# Patient Record
Sex: Male | Born: 1950 | Race: Asian | Marital: Married | Smoking: Current every day smoker
Health system: Southern US, Community
[De-identification: ages and names within clinical notes are randomized; demographics above are authoritative.]

## PROBLEM LIST (undated history)

## (undated) DIAGNOSIS — I2510 Atherosclerotic heart disease of native coronary artery without angina pectoris: Secondary | ICD-10-CM

## (undated) DIAGNOSIS — E119 Type 2 diabetes mellitus without complications: Secondary | ICD-10-CM

## (undated) DIAGNOSIS — R0989 Other specified symptoms and signs involving the circulatory and respiratory systems: Secondary | ICD-10-CM

## (undated) DIAGNOSIS — K219 Gastro-esophageal reflux disease without esophagitis: Secondary | ICD-10-CM

## (undated) DIAGNOSIS — I209 Angina pectoris, unspecified: Secondary | ICD-10-CM

## (undated) DIAGNOSIS — I10 Essential (primary) hypertension: Secondary | ICD-10-CM

## (undated) DIAGNOSIS — E785 Hyperlipidemia, unspecified: Secondary | ICD-10-CM

## (undated) DIAGNOSIS — D649 Anemia, unspecified: Secondary | ICD-10-CM

## (undated) DIAGNOSIS — R079 Chest pain, unspecified: Secondary | ICD-10-CM

## (undated) DIAGNOSIS — N433 Hydrocele, unspecified: Secondary | ICD-10-CM

## (undated) HISTORY — DX: Hydrocele, unspecified: N43.3

## (undated) HISTORY — DX: Gastro-esophageal reflux disease without esophagitis: K21.9

## (undated) HISTORY — DX: Atherosclerotic heart disease of native coronary artery without angina pectoris: I25.10

## (undated) HISTORY — DX: Chest pain, unspecified: R07.9

## (undated) HISTORY — PX: CORONARY ARTERY BYPASS GRAFT: SHX141

## (undated) HISTORY — DX: Anemia, unspecified: D64.9

## (undated) HISTORY — PX: CARDIAC CATHETERIZATION: SHX172

## (undated) HISTORY — DX: Angina pectoris, unspecified: I20.9

## (undated) HISTORY — DX: Other specified symptoms and signs involving the circulatory and respiratory systems: R09.89

## (undated) HISTORY — DX: Type 2 diabetes mellitus without complications: E11.9

## (undated) HISTORY — DX: Hyperlipidemia, unspecified: E78.5

## (undated) HISTORY — DX: Essential (primary) hypertension: I10

## (undated) HISTORY — PX: HERNIA REPAIR: SHX51

---

## 2017-05-27 ENCOUNTER — Other Ambulatory Visit: Payer: Self-pay | Admitting: Urology

## 2017-05-27 DIAGNOSIS — C61 Malignant neoplasm of prostate: Secondary | ICD-10-CM

## 2017-06-21 ENCOUNTER — Inpatient Hospital Stay
Admission: RE | Admit: 2017-06-21 | Discharge: 2017-06-21 | Disposition: A | Discharge status: Home / Self Care | Payer: Self-pay | Source: Ambulatory Visit | Attending: Urology | Admitting: Urology

## 2017-06-29 ENCOUNTER — Ambulatory Visit
Admission: RE | Admit: 2017-06-29 | Discharge: 2017-06-29 | Disposition: A | Discharge status: Home / Self Care | Payer: Medicare Other | Source: Ambulatory Visit | Attending: Urology | Admitting: Urology

## 2017-06-29 DIAGNOSIS — C61 Malignant neoplasm of prostate: Secondary | ICD-10-CM

## 2017-06-29 MED ORDER — GADOBENATE DIMEGLUMINE 529 MG/ML IV SOLN
17.0000 mL | Freq: Once | INTRAVENOUS | Status: AC | PRN
Start: 2017-06-29 — End: 2017-06-29
  Administered 2017-06-29: 17 mL via INTRAVENOUS

## 2018-06-03 ENCOUNTER — Ambulatory Visit (INDEPENDENT_AMBULATORY_CARE_PROVIDER_SITE_OTHER): Payer: Medicare Other | Admitting: Cardiology

## 2018-06-03 ENCOUNTER — Ambulatory Visit: Payer: Self-pay | Admitting: Cardiology

## 2018-06-03 ENCOUNTER — Encounter: Payer: Self-pay | Admitting: Cardiology

## 2018-06-03 VITALS — BP 136/97 | HR 91 | Ht 67.0 in | Wt 180.2 lb

## 2018-06-03 DIAGNOSIS — Z951 Presence of aortocoronary bypass graft: Secondary | ICD-10-CM

## 2018-06-03 DIAGNOSIS — R0989 Other specified symptoms and signs involving the circulatory and respiratory systems: Secondary | ICD-10-CM | POA: Insufficient documentation

## 2018-06-03 DIAGNOSIS — Z87891 Personal history of nicotine dependence: Secondary | ICD-10-CM | POA: Diagnosis not present

## 2018-06-03 DIAGNOSIS — E1165 Type 2 diabetes mellitus with hyperglycemia: Secondary | ICD-10-CM

## 2018-06-03 DIAGNOSIS — I2510 Atherosclerotic heart disease of native coronary artery without angina pectoris: Secondary | ICD-10-CM

## 2018-06-03 DIAGNOSIS — E785 Hyperlipidemia, unspecified: Secondary | ICD-10-CM

## 2018-06-03 DIAGNOSIS — F17200 Nicotine dependence, unspecified, uncomplicated: Secondary | ICD-10-CM | POA: Insufficient documentation

## 2018-06-03 DIAGNOSIS — IMO0002 Reserved for concepts with insufficient information to code with codable children: Secondary | ICD-10-CM | POA: Insufficient documentation

## 2018-06-03 DIAGNOSIS — R079 Chest pain, unspecified: Secondary | ICD-10-CM | POA: Insufficient documentation

## 2018-06-03 NOTE — Progress Notes (Signed)
Subjective:   @Patient  ID@: Mark Combs, male    DOB: [birth year], 68 y.o.   MRN: [id number]  Jilda Panda, MD:  No chief complaint on file.   HPI: Mark Combs  is a 68 y.o. male  with hypertension, uncontrolled type 2 diabetes, hyperlipidemia, former tobacco use, CAD s/p CABG x4 in [place], [place] in 2012 recently evaluated by [place] for chest pain concerning for angina.   HPI   Patient reports that the prior chest pain, that he experienced during emotional distress, has resolved. He has not been having any chest tightness or pain. He does mention the he still experiences upper chest soreness at his incision related to lifting some heavy boxes at work. He has not used any nitroglycerin. Denies any shortness of breath, leg swelling, palpitations, or symtpoms of claudication or TIA. He is still on Lipitor and has had some labs performed with PCP in January. He reports that his glucose and lipids have been high in his recent lab work but hasn't discussed the results with his PCP as of now.  He has recently made lifestyle changes and quit smoking three weeks ago. He is currently not exercising due to long working hours and discomfort and pain in his knee joints. He reports a persistent cough that has been treated with oral antibiotics for a month with marked improvement up to date.    Past Medical History:  Diagnosis Date  . Anemia   . Angina pectoris ([place])   . Coronary artery disease   . Diabetes mellitus without complication ([place])   . GERD (gastroesophageal reflux disease)   . Hydrocele   . Hyperlipidemia   . Hypertension     Past Surgical History:  Procedure Laterality Date  . CORONARY ARTERY BYPASS GRAFT    . HERNIA REPAIR      No family history on file.  Social History   Socioeconomic History  . Marital status: Married    Spouse name: Not on file  . Number of children: Not on file  . Years of education: Not on file  . Highest education level: Not on file    Occupational History  . Not on file  Social Needs  . Financial resource strain: Not on file  . Food insecurity:    Worry: Not on file    Inability: Not on file  . Transportation needs:    Medical: Not on file    Non-medical: Not on file  Tobacco Use  . Smoking status: Current Every Day Smoker  . Smokeless tobacco: Never Used  Substance and Sexual Activity  . Alcohol use: Not on file  . Drug use: Not on file  . Sexual activity: Not on file  Lifestyle  . Physical activity:    Days per week: Not on file    Minutes per session: Not on file  . Stress: Not on file  Relationships  . Social connections:    Talks on phone: Not on file    Gets together: Not on file    Attends religious service: Not on file    Active member of club or organization: Not on file    Attends meetings of clubs or organizations: Not on file    Relationship status: Not on file  . Intimate partner violence:    Fear of current or ex partner: Not on file    Emotionally abused: Not on file    Physically abused: Not on file    Forced sexual activity: Not  on file  Other Topics Concern  . Not on file  Social History Narrative  . Not on file    Current Meds  Medication Sig  . acetaminophen (TYLENOL) 500 MG tablet Take 500 mg by mouth every 6 (six) hours as needed.  Marland Kitchen aspirin EC 81 MG tablet Take 81 mg by mouth daily.  Marland Kitchen atorvastatin (LIPITOR) 40 MG tablet Take 40 mg by mouth daily.  . dapagliflozin propanediol (FARXIGA) 10 MG TABS tablet Take 10 mg by mouth daily.  Marland Kitchen esomeprazole (NEXIUM) 40 MG capsule Take 40 mg by mouth daily at 12 noon.  Marland Kitchen glimepiride (AMARYL) 2 MG tablet Take 2 mg by mouth daily with breakfast.  . metoprolol succinate (TOPROL-XL) 25 MG 24 hr tablet Take 25 mg by mouth 2 (two) times daily.   . nitroGLYCERIN (NITROSTAT) 0.4 MG SL tablet Place 0.4 mg under the tongue every 5 (five) minutes as needed for chest pain.  . tamsulosin (FLOMAX) 0.4 MG CAPS capsule Take 0.4 mg by mouth daily.  .  valsartan (DIOVAN) 80 MG tablet Take 80 mg by mouth daily.     Review of Systems  Constitution: Negative for decreased appetite, malaise/fatigue, weight gain and weight loss.  Eyes: Negative for visual disturbance.  Cardiovascular: Negative for chest pain (no chest tightness), claudication and leg swelling.  Respiratory: Negative for hemoptysis, shortness of breath and wheezing.   Endocrine: Negative for cold intolerance and heat intolerance.  Skin: Negative for nail changes.  Musculoskeletal: Negative for myalgias.  Gastrointestinal: Negative for abdominal pain, nausea and vomiting.  Neurological: Negative for difficulty with concentration, dizziness, focal weakness and headaches.  Psychiatric/Behavioral: Negative for altered mental status and suicidal ideas.  All other systems reviewed and are negative.   Cardiac studies:   Echocardiogram 03/27/2018: Left ventricle cavity is normal in size. Mild concentric hypertrophy of the left ventricle. Abnormal septal wall motion due to post-operative coronary artery bypass graft. Doppler evidence of grade I (impaired) diastolic dysfunction, normal LAP. Calculated EF 55%. Left atrial cavity is mildly dilated. Mild tricuspid regurgitation. Estimated pulmonary artery systolic pressure 30 mmHg.  Exercise sestamibi stress test 03/31/2018: 1. The patient performed treadmill exercise using Bruce protocol, completing 5:01 minutes. The patient completed an estimated workload of 7 METS, reaching 99% of the maximum predicted heart rate. Normal hemodynamic response was seen. No stress symptoms reported. Exercise capacity was low. No ischemic changes seen on stress electrocardiogram. 2. The overall quality of the study is good. Left ventricular cavity is noted to be normal on the rest and stress studies. Gated SPECT images reveal normal myocardial thickening and wall motion. The left ventricular ejection fraction was calculated or visually estimated to be  53%. SPECT images reveal small size, mild intensity, mildly reversible perfusion defect in basal inferior/inferolateral myocardium suggestive of ischemia.  3. Low risk study.  Carotid artery duplex 03/27/2018: No hemodynamically significant arterial disease in the internal carotid artery bilaterally. There is minimal plaque bilateral carotids. Antegrade right vertebral artery flow. Antegrade left vertebral artery flow.  Coronary angiograms 05/04/2010: Normal LV systolic function, mid LAD 95% stenosis,  D1 tandem 75% stenosis, proximal circumflex 95% stenosis and extends into large OM1.  Mid RCA 75% stenosis.  Normal LVEF.    Objective:     Today's Vitals   06/03/18 1115  BP: (!) 136/97  Pulse: 91  SpO2: 96%  Weight: 180 lb 3.2 oz (81.7 kg)  Height: 5\' 7"  (1.702 m)   Body mass index is 28.22 kg/m.    Physical  Exam  Constitutional: He is oriented to person, place, and time. Vital signs are normal. He appears well-developed and well-nourished.  HENT:  Head: Normocephalic and atraumatic.  Neck: Normal range of motion.  Cardiovascular: Normal rate, regular rhythm, normal heart sounds and intact distal pulses.  Pulses:      Carotid pulses are on the right side with bruit and 2+ on the left side.      Dorsalis pedis pulses are 2+ on the right side and 2+ on the left side.       Posterior tibial pulses are 2+ on the right side and 2+ on the left side.  Pulmonary/Chest: Effort normal and breath sounds normal. No accessory muscle usage. No respiratory distress.  Abdominal: Soft. Bowel sounds are normal.  Musculoskeletal: Normal range of motion.  Neurological: He is alert and oriented to person, place, and time.  Skin: Skin is warm and dry.  Vitals reviewed.      Assessment & Recommendations:   1. Atherosclerosis of native coronary artery of native heart without angina pectoris Presently without symptoms of angina, previous chest pain has resolved. Currently under appropriate  medication. Patient was recommended to take nitro when symptoms of chest pain or chest tightness appear. It was recommended to the patient to exercise more preferably in the pool to reduce the pressure on his joints.  2. History of coronary artery bypass graft As stated above on appropriate medical therapy.  3. Former tobacco use Patient has quit smoking recently. Encouraged continued smoking secession.   4. Uncontrolled type 2 diabetes mellitus with hyperglycemia ([place]) On previous PCP office insulin therapy was recommended, however patient did not want to start on insulin and has continued with [place]. Recent labs from two weeks ago haven't been discussed with patient yet. He will continue to follow up with his PCP regarding the therapeutic management.   5. Hyperlipidemia LDL goal <70 Recent labs unavailable at this time, we will request from PCP office. Continued to be managed by PCP.  Follow up was scheduled in 6 months. Further evaluation or changes if needed will be done after receiving lab values.  Patient was advised to call the office if there any symptoms develop prior to 6 month follow up.     Jeri Lager, FNP-C [facility], [place] Office: [phone] Fax: [phone]

## 2018-07-07 ENCOUNTER — Other Ambulatory Visit: Payer: Self-pay | Admitting: Cardiology

## 2018-09-02 ENCOUNTER — Telehealth: Payer: Self-pay

## 2018-09-02 ENCOUNTER — Other Ambulatory Visit: Payer: Self-pay

## 2018-09-02 NOTE — Telephone Encounter (Signed)
Patient left message with answering service requesting refill on BP med. Patients number is [phone]

## 2018-09-03 MED ORDER — OLMESARTAN MEDOXOMIL 20 MG PO TABS: 20.0000 mg | ORAL_TABLET | Freq: Every day | ORAL | 3 refills | Status: DC

## 2018-12-01 ENCOUNTER — Other Ambulatory Visit: Payer: Self-pay | Admitting: Cardiology

## 2018-12-03 ENCOUNTER — Ambulatory Visit (INDEPENDENT_AMBULATORY_CARE_PROVIDER_SITE_OTHER): Payer: Medicare Other | Admitting: Cardiology

## 2018-12-03 ENCOUNTER — Encounter: Payer: Self-pay | Admitting: Cardiology

## 2018-12-03 ENCOUNTER — Other Ambulatory Visit: Payer: Self-pay

## 2018-12-03 VITALS — BP 132/79 | HR 82 | Ht 67.0 in | Wt 180.0 lb

## 2018-12-03 DIAGNOSIS — E1165 Type 2 diabetes mellitus with hyperglycemia: Secondary | ICD-10-CM | POA: Diagnosis not present

## 2018-12-03 DIAGNOSIS — Z87891 Personal history of nicotine dependence: Secondary | ICD-10-CM

## 2018-12-03 DIAGNOSIS — E785 Hyperlipidemia, unspecified: Secondary | ICD-10-CM | POA: Diagnosis not present

## 2018-12-03 DIAGNOSIS — Z951 Presence of aortocoronary bypass graft: Secondary | ICD-10-CM | POA: Diagnosis not present

## 2018-12-03 DIAGNOSIS — I2510 Atherosclerotic heart disease of native coronary artery without angina pectoris: Secondary | ICD-10-CM | POA: Diagnosis not present

## 2018-12-03 NOTE — Progress Notes (Signed)
Primary Physician:  Jilda Panda, MD   Patient ID: Mark Combs, male    DOB: 1950/11/21, 68 y.o.   MRN: [id number]  Subjective:    Chief Complaint  Patient presents with  . Coronary Artery Disease  . Hyperlipidemia  . Follow-up    HPI: Mark Combs  is a 68 y.o. male  with hypertension, uncontrolled type 2 diabetes, hyperlipidemia, former tobacco use, CAD s/p CABG x4 in [place], [place] in 2012.  He does mention the he still experiences upper chest soreness at his incision related to lifting some heavy boxes at work. He has not used any nitroglycerin. Denies any shortness of breath, leg swelling, palpitations, or symtpoms of claudication or TIA. He is still on Lipitor and has recently had labs, will discuss with PCP in a few weeks. Diabetes is still uncontrolled, recently started new medication to help.   He has remained abstinent from tobacco use. Continues to have pain in his knee that limits his activity.    Past Medical History:  Diagnosis Date  . Anemia   . Angina pectoris ([place])   . Carotid bruit   . Chest pain   . Coronary artery disease   . Diabetes mellitus without complication ([place])   . GERD (gastroesophageal reflux disease)   . Hydrocele   . Hyperlipidemia   . Hypertension     Past Surgical History:  Procedure Laterality Date  . CORONARY ARTERY BYPASS GRAFT    . HERNIA REPAIR      Social History   Socioeconomic History  . Marital status: Married    Spouse name: Not on file  . Number of children: 2  . Years of education: Not on file  . Highest education level: Not on file  Occupational History  . Not on file  Social Needs  . Financial resource strain: Not on file  . Food insecurity    Worry: Not on file    Inability: Not on file  . Transportation needs    Medical: Not on file    Non-medical: Not on file  Tobacco Use  . Smoking status: Former Smoker    Packs/day: 0.25    Years: 41.00    Pack years: 10.25    Types: Cigarettes    Quit date:  07/2018    Years since quitting: 0.3  . Smokeless tobacco: Never Used  Substance and Sexual Activity  . Alcohol use: Yes    Comment: scotch  . Drug use: Not on file  . Sexual activity: Not on file  Lifestyle  . Physical activity    Days per week: Not on file    Minutes per session: Not on file  . Stress: Not on file  Relationships  . Social Herbalist on phone: Not on file    Gets together: Not on file    Attends religious service: Not on file    Active member of club or organization: Not on file    Attends meetings of clubs or organizations: Not on file    Relationship status: Not on file  . Intimate partner violence    Fear of current or ex partner: Not on file    Emotionally abused: Not on file    Physically abused: Not on file    Forced sexual activity: Not on file  Other Topics Concern  . Not on file  Social History Narrative  . Not on file    Review of Systems  Constitution: Negative for decreased appetite, malaise/fatigue,  weight gain and weight loss.  Eyes: Negative for visual disturbance.  Cardiovascular: Positive for chest pain (no chest tightness). Negative for claudication and leg swelling.  Respiratory: Negative for hemoptysis, shortness of breath and wheezing.   Endocrine: Negative for cold intolerance and heat intolerance.  Skin: Negative for nail changes.  Musculoskeletal: Negative for myalgias.  Gastrointestinal: Negative for abdominal pain, nausea and vomiting.  Neurological: Negative for difficulty with concentration, dizziness, focal weakness and headaches.  Psychiatric/Behavioral: Negative for altered mental status and suicidal ideas.  All other systems reviewed and are negative.     Objective:  Blood pressure 132/79, pulse 82, height 5\' 7"  (1.702 m), weight 180 lb (81.6 kg), SpO2 96 %. Body mass index is 28.19 kg/m.    Physical Exam  Constitutional: He is oriented to person, place, and time. Vital signs are normal. He appears  well-developed and well-nourished.  HENT:  Head: Normocephalic and atraumatic.  Neck: Normal range of motion.  Cardiovascular: Normal rate, regular rhythm, normal heart sounds and intact distal pulses.  Pulses:      Carotid pulses are on the right side with bruit and 2+ on the left side.      Dorsalis pedis pulses are 2+ on the right side and 2+ on the left side.       Posterior tibial pulses are 2+ on the right side and 2+ on the left side.  Pulmonary/Chest: Effort normal and breath sounds normal. No accessory muscle usage. No respiratory distress.  Abdominal: Soft. Bowel sounds are normal.  Musculoskeletal: Normal range of motion.  Neurological: He is alert and oriented to person, place, and time.  Skin: Skin is warm and dry.  Vitals reviewed.  Radiology: No results found.  Laboratory examination:    No flowsheet data found. No flowsheet data found. Lipid Panel  No results found for: CHOL, TRIG, HDL, CHOLHDL, VLDL, LDLCALC, LDLDIRECT HEMOGLOBIN A1C No results found for: HGBA1C, MPG TSH No results for input(s): TSH in the last 8760 hours.  PRN Meds:. There are no discontinued medications. Current Meds  Medication Sig  . acetaminophen (TYLENOL) 500 MG tablet Take 500 mg by mouth every 6 (six) hours as needed.  Marland Kitchen aspirin EC 81 MG tablet Take 81 mg by mouth daily.  Marland Kitchen atorvastatin (LIPITOR) 40 MG tablet Take 40 mg by mouth daily.  . dapagliflozin propanediol (FARXIGA) 10 MG TABS tablet Take 10 mg by mouth daily.  Marland Kitchen esomeprazole (NEXIUM) 40 MG capsule Take 40 mg by mouth daily at 12 noon.  Marland Kitchen exenatide (BYETTA) 5 MCG/0.02ML SOPN injection Inject 2 mcg into the skin 2 (two) times daily with a meal.  . glimepiride (AMARYL) 2 MG tablet Take 2 mg by mouth daily with breakfast.  . meloxicam (MOBIC) 15 MG tablet Take 15 mg by mouth daily.  . metoprolol succinate (TOPROL-XL) 25 MG 24 hr tablet Take 25 mg by mouth 2 (two) times daily.   . nitroGLYCERIN (NITROSTAT) 0.4 MG SL tablet  Place 0.4 mg under the tongue every 5 (five) minutes as needed for chest pain.  Marland Kitchen olmesartan (BENICAR) 20 MG tablet TAKE 1 TABLET (20 MG TOTAL) BY MOUTH DAILY.  . tamsulosin (FLOMAX) 0.4 MG CAPS capsule Take 0.4 mg by mouth daily.    Cardiac Studies:   Echocardiogram 03/27/2018: Left ventricle cavity is normal in size. Mild concentric hypertrophy of the left ventricle. Abnormal septal wall motion due to post-operative coronary artery bypass graft. Doppler evidence of grade I (impaired) diastolic dysfunction, normal LAP. Calculated EF 55%.  Left atrial cavity is mildly dilated. Mild tricuspid regurgitation. Estimated pulmonary artery systolic pressure 30 mmHg.  Exercise sestamibi stress test 03/31/2018: 1. The patient performed treadmill exercise using Bruce protocol, completing 5:01 minutes. The patient completed an estimated workload of 7 METS, reaching 99% of the maximum predicted heart rate. Normal hemodynamic response was seen. No stress symptoms reported. Exercise capacity was low. No ischemic changes seen on stress electrocardiogram. 2. The overall quality of the study is good. Left ventricular cavity is noted to be normal on the rest and stress studies. Gated SPECT images reveal normal myocardial thickening and wall motion. The left ventricular ejection fraction was calculated or visually estimated to be 53%. SPECT images reveal small size, mild intensity, mildly reversible perfusion defect in basal inferior/inferolateral myocardium suggestive of ischemia.  3. Low risk study.  Carotid artery duplex 03/27/2018: No hemodynamically significant arterial disease in the internal carotid artery bilaterally. There is minimal plaque bilateral carotids. Antegrade right vertebral artery flow. Antegrade left vertebral artery flow.  Coronary angiograms 05/04/2010: Normal LV systolic function, mid LAD 95% stenosis,  D1 tandem 75% stenosis, proximal circumflex 95% stenosis and extends into large  OM1.  Mid RCA 75% stenosis.  Normal LVEF.  Assessment:     ICD-10-CM   1. Atherosclerosis of native coronary artery of native heart without angina pectoris  I25.10 EKG 12-Lead  2. History of coronary artery bypass graft  Z95.1   3. Uncontrolled type 2 diabetes mellitus with hyperglycemia ([facility])  E11.65   4. Hyperlipidemia LDL goal <70  E78.5   5. Former tobacco use  Z87.891     EKG 12/03/2018: Normal sinus rhythm at 81 bpm, normal axis, nonspecific T wave abnormality.   Recommendations:   Patient is presently doing well without any symptoms of angina.  Denies any dyspnea on exertion.  No clinical evidence of heart failure.  Physical exam and EKG have remained stable.  He does continue to report chest pain his incision from previous CABG.  Continue to feel musculoskeletal etiology.  Not associated with exertion.  He reports his diabetes continues to be uncontrolled, recently started on Bactrim on and is to follow-up with his PCP in the next few weeks.  He does report having lipids performed a few weeks ago and is going to discuss results with his PCP at his follow-up.  I will request for our records.  Continue with statin and aspirin therapy. Blood pressure is well controlled.  Overall, patient is presently doing well without any significant complaints.  I have encouraged him to again start using his stationary bicycle for regular exercise.  I congratulated him on quitting smoking and urged him to continue with this.  I will see him back in 6 months or sooner if needed.  Miquel Dunn, MSN, APRN, FNP-C [facility]. [place] Office: [phone] Fax: [phone]

## 2019-06-05 ENCOUNTER — Ambulatory Visit: Payer: Medicare Other | Admitting: Cardiology

## 2019-06-05 ENCOUNTER — Other Ambulatory Visit: Payer: Self-pay

## 2019-06-05 ENCOUNTER — Encounter: Payer: Self-pay | Admitting: Cardiology

## 2019-06-05 VITALS — BP 132/82 | HR 84 | Temp 97.6°F | Ht 67.0 in | Wt 185.2 lb

## 2019-06-05 DIAGNOSIS — Z87891 Personal history of nicotine dependence: Secondary | ICD-10-CM

## 2019-06-05 DIAGNOSIS — Z951 Presence of aortocoronary bypass graft: Secondary | ICD-10-CM

## 2019-06-05 DIAGNOSIS — E785 Hyperlipidemia, unspecified: Secondary | ICD-10-CM

## 2019-06-05 DIAGNOSIS — I2510 Atherosclerotic heart disease of native coronary artery without angina pectoris: Secondary | ICD-10-CM

## 2019-06-05 DIAGNOSIS — I10 Essential (primary) hypertension: Secondary | ICD-10-CM

## 2019-06-05 NOTE — Progress Notes (Signed)
Primary Physician:  Jilda Panda, MD   Patient ID: Mark Combs, male    DOB: 1951/01/07, 69 y.o.   MRN: [id number]  Subjective:    Chief Complaint  Patient presents with  . Coronary Artery Disease  . Hypertension  . Follow-up    6 month    HPI: Mark Combs  is a 69 y.o. male  with hypertension, uncontrolled type 2 diabetes, hyperlipidemia, former tobacco use, CAD s/p CABG x4 in [place], [place] in 2012.  Presents for 6 month follow up. No chest pain. Denies any shortness of breath, leg swelling, palpitations, or symtpoms of claudication or TIA. On Lipitor for hyperlipidemia. Diabetes is now controlled. He has made diet changes.   He has not been exercising.   He has remained abstinent from tobacco use. Continues to have pain in his knee that limits his activity.    Past Medical History:  Diagnosis Date  . Anemia   . Angina pectoris ([place])   . Carotid bruit   . Chest pain   . Coronary artery disease   . Diabetes mellitus without complication ([place])   . GERD (gastroesophageal reflux disease)   . Hydrocele   . Hyperlipidemia   . Hypertension     Past Surgical History:  Procedure Laterality Date  . CORONARY ARTERY BYPASS GRAFT    . HERNIA REPAIR     Social History   Tobacco Use  . Smoking status: Former Smoker    Packs/day: 0.25    Years: 41.00    Pack years: 10.25    Types: Cigarettes    Quit date: 07/2018    Years since quitting: 0.8  . Smokeless tobacco: Never Used  Substance Use Topics  . Alcohol use: Yes    Comment: scotch    Review of Systems  Constitution: Negative for decreased appetite, malaise/fatigue, weight gain and weight loss.  Eyes: Negative for visual disturbance.  Cardiovascular: Negative for chest pain, claudication and leg swelling.  Respiratory: Negative for hemoptysis, shortness of breath and wheezing.   Endocrine: Negative for cold intolerance and heat intolerance.  Skin: Negative for nail changes.  Musculoskeletal: Positive for  arthritis. Negative for myalgias.  Gastrointestinal: Negative for abdominal pain, nausea and vomiting.  Neurological: Negative for difficulty with concentration, dizziness, focal weakness and headaches.  Psychiatric/Behavioral: Negative for altered mental status and suicidal ideas.  All other systems reviewed and are negative.     Objective:  Blood pressure 132/82, pulse 84, temperature 97.6 F (36.4 C), height 5\' 7"  (1.702 m), weight 185 lb 3.2 oz (84 kg), SpO2 95 %. Body mass index is 29.01 kg/m.    Physical Exam  Constitutional: He is oriented to person, place, and time. Vital signs are normal. He appears well-developed and well-nourished.  HENT:  Head: Normocephalic and atraumatic.  Cardiovascular: Normal rate, regular rhythm, normal heart sounds and intact distal pulses.  Pulses:      Carotid pulses are on the right side with bruit and 2+ on the left side.      Dorsalis pedis pulses are 2+ on the right side and 2+ on the left side.       Posterior tibial pulses are 2+ on the right side and 2+ on the left side.  Pulmonary/Chest: Effort normal and breath sounds normal. No accessory muscle usage. No respiratory distress.  Abdominal: Soft. Bowel sounds are normal.  Musculoskeletal:        General: Normal range of motion.     Cervical back: Normal range of motion.  Neurological: He is alert and oriented to person, place, and time.  Skin: Skin is warm and dry.  Vitals reviewed.  Radiology: No results found.  Laboratory examination:    No flowsheet data found. No flowsheet data found. Lipid Panel  No results found for: CHOL, TRIG, HDL, CHOLHDL, VLDL, LDLCALC, LDLDIRECT HEMOGLOBIN A1C No results found for: HGBA1C, MPG TSH No results for input(s): TSH in the last 8760 hours.  PRN Meds:. Medications Discontinued During This Encounter  Medication Reason  . Exenatide ER (BYDUREON) 2 MG PEN Error   Current Meds  Medication Sig  . acetaminophen (TYLENOL) 500 MG tablet Take  500 mg by mouth every 6 (six) hours as needed.  Marland Kitchen aspirin EC 81 MG tablet Take 81 mg by mouth daily.  Marland Kitchen atorvastatin (LIPITOR) 40 MG tablet Take 40 mg by mouth daily.  . dapagliflozin propanediol (FARXIGA) 10 MG TABS tablet Take 10 mg by mouth daily.  Marland Kitchen esomeprazole (NEXIUM) 40 MG capsule Take 40 mg by mouth daily at 12 noon.  Marland Kitchen glimepiride (AMARYL) 2 MG tablet Take 2 mg by mouth daily with breakfast.  . meloxicam (MOBIC) 15 MG tablet Take 15 mg by mouth daily.  . metoprolol succinate (TOPROL-XL) 25 MG 24 hr tablet Take 25 mg by mouth 2 (two) times daily.   . nitroGLYCERIN (NITROSTAT) 0.4 MG SL tablet Place 0.4 mg under the tongue every 5 (five) minutes as needed for chest pain.  Marland Kitchen olmesartan (BENICAR) 20 MG tablet TAKE 1 TABLET (20 MG TOTAL) BY MOUTH DAILY.  . tamsulosin (FLOMAX) 0.4 MG CAPS capsule Take 0.4 mg by mouth daily.    Cardiac Studies:   Echocardiogram 03/27/2018: Left ventricle cavity is normal in size. Mild concentric hypertrophy of the left ventricle. Abnormal septal wall motion due to post-operative coronary artery bypass graft. Doppler evidence of grade I (impaired) diastolic dysfunction, normal LAP. Calculated EF 55%. Left atrial cavity is mildly dilated. Mild tricuspid regurgitation. Estimated pulmonary artery systolic pressure 30 mmHg.  Exercise sestamibi stress test 03/31/2018: 1. The patient performed treadmill exercise using Bruce protocol, completing 5:01 minutes. The patient completed an estimated workload of 7 METS, reaching 99% of the maximum predicted heart rate. Normal hemodynamic response was seen. No stress symptoms reported. Exercise capacity was low. No ischemic changes seen on stress electrocardiogram. 2. The overall quality of the study is good. Left ventricular cavity is noted to be normal on the rest and stress studies. Gated SPECT images reveal normal myocardial thickening and wall motion. The left ventricular ejection fraction was calculated or  visually estimated to be 53%. SPECT images reveal small size, mild intensity, mildly reversible perfusion defect in basal inferior/inferolateral myocardium suggestive of ischemia.  3. Low risk study.  Carotid artery duplex 03/27/2018: No hemodynamically significant arterial disease in the internal carotid artery bilaterally. There is minimal plaque bilateral carotids. Antegrade right vertebral artery flow. Antegrade left vertebral artery flow.  Coronary angiograms 05/04/2010: Normal LV systolic function, mid LAD 95% stenosis,  D1 tandem 75% stenosis, proximal circumflex 95% stenosis and extends into large OM1.  Mid RCA 75% stenosis.  Normal LVEF.  Assessment:     ICD-10-CM   1. Atherosclerosis of native coronary artery of native heart without angina pectoris  I25.10 EKG 12-Lead  2. History of coronary artery bypass graft  Z95.1   3. Primary hypertension  I10   4. Hyperlipidemia LDL goal <70  E78.5   5. Former tobacco use  Z87.891     EKG 06/05/2019: Normal sinus rhythm at  82 bpm, normal axis, inferior infarct old, anteroseptal infarct old. Nonspecific T wave abnormality.   Recommendations:   Patient is here on 6 month office visit. He is doing well without any complaints. No symptoms of angina or clinical evidence of heart failure. He states that his diabetes and hyperlipidemia are well controlled. Hypertension is well controlled. I do not have recent labs, will obtain from PCP office. He is on appropriate medical therapy and tolerating medications well.   He has remained abstinent from tobacco use, I have congratulated him on this. Encouraged him to start regular exercise. I will see him back in 6 months for follow up.    Miquel Dunn, MSN, APRN, FNP-C [facility]. [place] Office: [phone] Fax: [phone]

## 2019-07-03 ENCOUNTER — Other Ambulatory Visit: Payer: Self-pay | Admitting: Cardiology

## 2019-08-03 ENCOUNTER — Encounter: Payer: Self-pay | Admitting: Cardiology

## 2019-08-03 ENCOUNTER — Ambulatory Visit: Payer: Medicare Other | Admitting: Cardiology

## 2019-08-03 ENCOUNTER — Other Ambulatory Visit: Payer: Self-pay

## 2019-08-03 VITALS — BP 161/97 | HR 105 | Temp 97.7°F | Resp 17 | Ht 67.0 in | Wt 185.0 lb

## 2019-08-03 DIAGNOSIS — Z951 Presence of aortocoronary bypass graft: Secondary | ICD-10-CM

## 2019-08-03 DIAGNOSIS — I25119 Atherosclerotic heart disease of native coronary artery with unspecified angina pectoris: Secondary | ICD-10-CM

## 2019-08-03 DIAGNOSIS — I10 Essential (primary) hypertension: Secondary | ICD-10-CM

## 2019-08-03 DIAGNOSIS — R079 Chest pain, unspecified: Secondary | ICD-10-CM

## 2019-08-03 DIAGNOSIS — I209 Angina pectoris, unspecified: Secondary | ICD-10-CM

## 2019-08-03 DIAGNOSIS — Z87891 Personal history of nicotine dependence: Secondary | ICD-10-CM

## 2019-08-03 DIAGNOSIS — E785 Hyperlipidemia, unspecified: Secondary | ICD-10-CM

## 2019-08-03 DIAGNOSIS — E1159 Type 2 diabetes mellitus with other circulatory complications: Secondary | ICD-10-CM

## 2019-08-03 MED ORDER — ISOSORBIDE MONONITRATE ER 30 MG PO TB24: 30.0000 mg | ORAL_TABLET | Freq: Every morning | ORAL | 3 refills | Status: DC

## 2019-08-03 MED ORDER — NITROGLYCERIN 0.4 MG SL SUBL: 0.4000 mg | SUBLINGUAL_TABLET | SUBLINGUAL | 0 refills | Status: DC | PRN

## 2019-08-03 NOTE — Progress Notes (Signed)
Mark Combs Date of Birth: [birth year] MRN: [id number] Primary Care Provider:Moreira, Carloyn Manner, MD  Date: 08/03/19 Last Office Visit: June 05, 2019  Chief Complaint  Patient presents with  . Chest Pain    HPI  Mark Combs is a 69 y.o.  male who presents to the office with a chief complaint of " chest pain." Patient's past medical history and cardiovascular risk factors include: hypertension, non-insulin-dependent diabetes mellitus type 2, hyperlipidemia, formertobacco use, CAD s/p CABG x4 in [place], [place] in 2012.  Patient was last seen in the office on 06/05/2019 by Miquel Dunn, MSN, APRN, FNP-C.  At last office visit patient was asymptomatic and was recommended to continue his current medical regimen.    Chest pain: Patient presents to the office as an add-on to today's schedule given his symptoms of chest pain and sooner than his scheduled appointment.  Patient stated that he started having chest discomfort approximately 4 days ago.  When he started noticing his chest discomfort he was resting.  His chest pain is located in the left upper quadrant of the chest which also involves the left shoulder girdle and upper arm.  Intensity is 8 out of 10, last for about 30 seconds, he thinks is secondary to heartburn and more prominent during the morning hours.  The pain is nonexertional and at times does improve with rest.  Patient has not been awoken up by this discomfort.  Patient has required 1 tablet of sublingual nitroglycerin yesterday.  Patient came to the office today for evaluation as the symptoms were very similar prior to his bypass surgery.  Later during the conversation patient states that he is not sure if this is secondary to CAD progression or due to stress at home.  Patient states that he has been getting into multiple arguments or other stressful situations also at times aggravate his discomfort.  History of coronary artery disease s/p CABG. Denies prior history of  myocardial infarction, congestive heart failure, deep venous thrombosis, pulmonary embolism, stroke, transient ischemic attack.  FUNCTIONAL STATUS: No structured exercise program or daily routine.  ALLERGIES: Allergies  Allergen Reactions  . Metformin And Related Nausea Only     MEDICATION LIST PRIOR TO VISIT: Current Outpatient Medications on File Prior to Visit  Medication Sig Dispense Refill  . acetaminophen (TYLENOL) 500 MG tablet Take 500 mg by mouth every 6 (six) hours as needed.    Marland Kitchen aspirin EC 81 MG tablet Take 81 mg by mouth daily.    Marland Kitchen atorvastatin (LIPITOR) 40 MG tablet Take 40 mg by mouth daily.    . dapagliflozin propanediol (FARXIGA) 10 MG TABS tablet Take 10 mg by mouth daily.    Marland Kitchen esomeprazole (NEXIUM) 40 MG capsule Take 40 mg by mouth daily at 12 noon.    Marland Kitchen glimepiride (AMARYL) 2 MG tablet Take 2 mg by mouth daily with breakfast.    . meloxicam (MOBIC) 15 MG tablet Take 15 mg by mouth daily.    . metFORMIN (GLUCOPHAGE) 500 MG tablet Take 500 mg by mouth 2 (two) times daily.    . metoprolol succinate (TOPROL-XL) 25 MG 24 hr tablet Take 25 mg by mouth 2 (two) times daily.     Marland Kitchen olmesartan (BENICAR) 20 MG tablet TAKE 1 TABLET (20 MG TOTAL) BY MOUTH DAILY. 30 tablet 6  . tamsulosin (FLOMAX) 0.4 MG CAPS capsule Take 0.4 mg by mouth daily.     No current facility-administered medications on file prior to visit.    PAST MEDICAL  HISTORY: Past Medical History:  Diagnosis Date  . Anemia   . Angina pectoris ([place])   . Carotid bruit   . Chest pain   . Coronary artery disease   . Diabetes mellitus without complication ([place])   . GERD (gastroesophageal reflux disease)   . Hydrocele   . Hyperlipidemia   . Hypertension     PAST SURGICAL HISTORY: Past Surgical History:  Procedure Laterality Date  . CARDIAC CATHETERIZATION    . CORONARY ARTERY BYPASS GRAFT    . HERNIA REPAIR      FAMILY HISTORY: The patient's family history includes Diabetes in his sister; Heart  disease in his brother.   SOCIAL HISTORY:  The patient  reports that he quit smoking about a year ago. His smoking use included cigarettes. He has a 10.25 pack-year smoking history. He has never used smokeless tobacco. He reports current alcohol use. He reports that he does not use drugs.  Review of Systems  Constitution: Negative for chills and fever.  HENT: Negative for ear discharge, ear pain and nosebleeds.   Eyes: Negative for blurred vision and discharge.  Cardiovascular: Positive for chest pain. Negative for claudication, dyspnea on exertion, leg swelling, near-syncope, orthopnea, palpitations, paroxysmal nocturnal dyspnea and syncope.  Respiratory: Negative for cough and shortness of breath.   Endocrine: Negative for polydipsia, polyphagia and polyuria.  Hematologic/Lymphatic: Negative for bleeding problem.  Skin: Negative for flushing and nail changes.  Musculoskeletal: Negative for muscle cramps, muscle weakness and myalgias.       Left arm/shoulder pain  Gastrointestinal: Negative for abdominal pain, dysphagia, hematemesis, hematochezia, melena, nausea and vomiting.  Neurological: Negative for dizziness, focal weakness and light-headedness.    PHYSICAL EXAM: Vitals with BMI 08/03/2019 06/05/2019 06/05/2019  Height 5\' 7"  - 5\' 7"   Weight 185 lbs - 185 lbs 3 oz  BMI [id number] - 29  Systolic Q000111Q Q000111Q 123456  Diastolic 97 82 88  Pulse 123456 - 84    CONSTITUTIONAL: Well-developed and well-nourished. No acute distress.  SKIN: Skin is warm and dry. No rash noted. No cyanosis. No pallor. No jaundice HEAD: Normocephalic and atraumatic.  EYES: No scleral icterus MOUTH/THROAT: Moist oral membranes.  NECK: No JVD present. No thyromegaly noted. No carotid bruits  LYMPHATIC: No visible cervical adenopathy.  CHEST Normal respiratory effort. No intercostal retractions.  Sternotomy site is well-healed. LUNGS: Clear to auscultation bilaterally.  No stridor. No wheezes. No rales.  CARDIOVASCULAR:  Regular rate and rhythm, positive S1-S2, no murmurs rubs or gallops appreciated ABDOMINAL: soft, nontender, nondistended, positive bowel sounds in all 4 quadrants, no apparent ascites.  EXTREMITIES: Trace bilateral peripheral edema, warm to touch. HEMATOLOGIC: No significant bruising NEUROLOGIC: Oriented to person, place, and time. Nonfocal. Normal muscle tone.  PSYCHIATRIC: Normal mood and affect. Normal behavior. Cooperative  CARDIAC DATABASE: Coronary artery bypass grafting: Four-vessel bypass (2012 in [place])  EKG: 08/03/2019: Sinus tachycardia, ventricular rate 106 bpm, left atrial enlargement, subtle ST depressions in the high lateral leads I and aVL cannot rule out underlying ischemia.  Echocardiogram: 03/27/2018: Left ventricle cavity is normal in size. Mild concentric hypertrophy of the left ventricle. Abnormal septal wall motion due to post-operative coronary artery bypass graft. Doppler evidence of grade I (impaired) diastolic dysfunction, normal LAP. Calculated EF 55%. Left atrial cavity is mildly dilated. Mild tricuspid regurgitation. Estimated pulmonary artery systolic pressure 30 mmHg.  Stress Testing:  Exercise sestamibi stress test 03/31/2018: Exercised for 5 minutes and 1 second, achieved 7 METS, 99% of age-predicted maximum heart rate, no  ischemic changes seen on stress ECG, gated SPECT reveals normal myocardial thickening and wall motion, 53% by gated SPECT. SPECT images reveal small size, mild intensity, mildly reversible perfusion defect in basal inferior/inferolateral myocardium suggestive of ischemia. Low risk study.  Heart Catheterization: [id number] LV systolic function, mid LAD 95% stenosis, D1 tandem 75% stenosis, proximal circumflex 95% stenosis and extends into large OM1. Mid RCA 75% stenosis. Normal LVEF.  Carotid duplex: 03/27/2018: No hemodynamically significant arterial disease in the internal carotid artery bilaterally. There is  minimal plaque bilateral carotids. Antegrade right vertebral artery flow. Antegrade left vertebral artery flow.  LABORATORY DATA: No flowsheet data found.  No flowsheet data found.  Lipid Panel  No results found for: CHOL, TRIG, HDL, CHOLHDL, VLDL, LDLCALC, LDLDIRECT, LABVLDL  No results found for: HGBA1C No components found for: NTPROBNP No results found for: TSH  Cardiac Panel (last 3 results) No results for input(s): CKTOTAL, CKMB, TROPONINIHS, RELINDX in the last 72 hours.  FINAL MEDICATION LIST END OF ENCOUNTER: Meds ordered this encounter  Medications  . nitroGLYCERIN (NITROSTAT) 0.4 MG SL tablet    Sig: Place 1 tablet (0.4 mg total) under the tongue every 5 (five) minutes as needed for chest pain.    Dispense:  90 tablet    Refill:  0  . isosorbide mononitrate (IMDUR) 30 MG 24 hr tablet    Sig: Take 1 tablet (30 mg total) by mouth in the morning.    Dispense:  90 tablet    Refill:  3    Medications Discontinued During This Encounter  Medication Reason  . nitroGLYCERIN (NITROSTAT) 0.4 MG SL tablet Reorder     Current Outpatient Medications:  .  acetaminophen (TYLENOL) 500 MG tablet, Take 500 mg by mouth every 6 (six) hours as needed., Disp: , Rfl:  .  aspirin EC 81 MG tablet, Take 81 mg by mouth daily., Disp: , Rfl:  .  atorvastatin (LIPITOR) 40 MG tablet, Take 40 mg by mouth daily., Disp: , Rfl:  .  dapagliflozin propanediol (FARXIGA) 10 MG TABS tablet, Take 10 mg by mouth daily., Disp: , Rfl:  .  esomeprazole (NEXIUM) 40 MG capsule, Take 40 mg by mouth daily at 12 noon., Disp: , Rfl:  .  glimepiride (AMARYL) 2 MG tablet, Take 2 mg by mouth daily with breakfast., Disp: , Rfl:  .  meloxicam (MOBIC) 15 MG tablet, Take 15 mg by mouth daily., Disp: , Rfl:  .  metFORMIN (GLUCOPHAGE) 500 MG tablet, Take 500 mg by mouth 2 (two) times daily., Disp: , Rfl:  .  metoprolol succinate (TOPROL-XL) 25 MG 24 hr tablet, Take 25 mg by mouth 2 (two) times daily. , Disp: , Rfl:  .   olmesartan (BENICAR) 20 MG tablet, TAKE 1 TABLET (20 MG TOTAL) BY MOUTH DAILY., Disp: 30 tablet, Rfl: 6 .  tamsulosin (FLOMAX) 0.4 MG CAPS capsule, Take 0.4 mg by mouth daily., Disp: , Rfl:  .  isosorbide mononitrate (IMDUR) 30 MG 24 hr tablet, Take 1 tablet (30 mg total) by mouth in the morning., Disp: 90 tablet, Rfl: 3 .  nitroGLYCERIN (NITROSTAT) 0.4 MG SL tablet, Place 1 tablet (0.4 mg total) under the tongue every 5 (five) minutes as needed for chest pain., Disp: 90 tablet, Rfl: 0  IMPRESSION:    ICD-10-CM   1. Angina pectoris (HCC)  I20.9 nitroGLYCERIN (NITROSTAT) 0.4 MG SL tablet    PCV MYOCARDIAL PERFUSION WITH LEXISCAN    PCV ECHOCARDIOGRAM COMPLETE    isosorbide mononitrate (IMDUR) 30  MG 24 hr tablet  2. Atherosclerosis of native coronary artery of native heart with angina pectoris (HCC)  I25.119 nitroGLYCERIN (NITROSTAT) 0.4 MG SL tablet  3. History of 4 vessel coronary artery bypass graft  Z95.1 nitroGLYCERIN (NITROSTAT) 0.4 MG SL tablet  4. Benign hypertension  I10   5. Hyperlipidemia LDL goal <70  E78.5   6. Former tobacco use  Z87.891   7. Type 2 diabetes mellitus with other circulatory complication, without long-term current use of insulin (HCC)  E11.59   8. Chest pain of uncertain etiology  [id number] EKG 12-Lead     RECOMMENDATIONS: Mark Combs is a 69 y.o. male whose past medical history and cardiovascular risk factors include: hypertension, non-insulin-dependent type 2 diabetes, hyperlipidemia, formertobacco use, CAD s/p CABG x4 in [place], [place] in 2012.  Angina pectoris:  Patient symptoms of chest discomfort are atypical; however, it may be consistent with his anginal equivalent.  EKG findings noted above.  Patient currently does not have any active chest discomfort during the office visit.  Patient is requesting a refill on his sublingual nitroglycerin tablet.  Patient's blood pressure is not well controlled.  Patient states that he forgot to take his medications.   I have asked him to call the office if his systolic blood pressures are consistently greater than 140 mmHg so the medications can further be uptitrated.  We will add isosorbide mononitrate 30 mg p.o. every morning as part of his antianginal therapy.  Patient is well aware of the drug to drug interactions between nitrates and phosphodiesterase 5 inhibitors such as Viagra/sildenafil, tadalafil/Cialis, Levitra/vardenafil.  Patient states that he has no access to such medications and is aware of not taking them.  Antianginal therapy includes beta-blocker and Imdur.  I educated the patient extensively in regards to seeking medical attention at the closest ER via EMS if his symptoms were progressive in regards to intensity, frequency, duration.  He is also requested to seek medical attention if he has typical chest pain as discussed in the office.  Echocardiogram will be ordered to evaluate for structural heart disease and left ventricular systolic function.  Nuclear stress test recommended to evaluate for reversible ischemia.  Established coronary artery disease with angina pectoris and history of four-vessel bypass surgery: See above.  Benign essential hypertension: . Office blood pressure is not at goal.  Patient has not taken his morning medications. . Medication reconciled.  Marland Kitchen He is asked to keep a log of both blood pressure and pulse so that medications can be titrated based on a trend as opposed to isolated blood pressure readings in the office. . If the blood pressure is consistently greater than 178mmHg patient is asked to call the office or his primary care provider's office for medication titration sooner than the next office visit.  . Low salt diet recommended. A diet that is rich in fruits, vegetables, legumes, and low-fat dairy products and low in snacks, sweets, and meats (such as the Dietary Approaches to Stop Hypertension [DASH] diet).   Non-insulin-dependent diabetes mellitus type  2: Currently managed by primary care provider.  Patient understands importance of glycemic control given his cardiovascular history.  Mixed hyperlipidemia: I do not have a more recent lipid profile for review at today's office visit.  Continue statin therapy.  Patient does not endorse any myalgias.  We will try to get records from her [place] in regards to his surgical anatomy and most recent blood work from his PCPs office.  Orders Placed This Encounter  Procedures  . PCV MYOCARDIAL PERFUSION WITH LEXISCAN  . EKG 12-Lead  . PCV ECHOCARDIOGRAM COMPLETE   --Continue cardiac medications as reconciled in final medication list. --Return in about 2 weeks (around 08/17/2019). Or sooner if needed. --Continue follow-up with your primary care physician regarding the management of your other chronic comorbid conditions.  Patient's questions and concerns were addressed to his satisfaction. He voices understanding of the instructions provided during this encounter.   During this visit I reviewed and updated: Tobacco history  allergies medication reconciliation  medical history  surgical history  family history  social history.  This note was created using a voice recognition software as a result there may be grammatical errors inadvertently enclosed that do not reflect the nature of this encounter. Every attempt is made to correct such errors.  Rex Kras, [place], [facility]  Pager: [phone] Office: [phone]

## 2019-08-03 NOTE — Patient Instructions (Signed)
Please remember to bring in your medication bottles in at the next visit.   New Medications that were added at today's visit:  Isosorbide mononitrate 30 mg p.o. every morning Sublingual nitroglycerin tablets to use as needed basis  Medications that were discontinued at today's visit: None  Office will call you to have the following tests scheduled:  Echocardiogram. Stress test.  Recommend follow up with your PCP as scheduled.

## 2019-08-06 ENCOUNTER — Other Ambulatory Visit: Payer: Medicare Other

## 2019-08-12 ENCOUNTER — Ambulatory Visit: Payer: Medicare Other

## 2019-08-12 ENCOUNTER — Other Ambulatory Visit: Payer: Self-pay

## 2019-08-12 DIAGNOSIS — I209 Angina pectoris, unspecified: Secondary | ICD-10-CM

## 2019-08-25 ENCOUNTER — Ambulatory Visit: Payer: Medicare Other | Admitting: Cardiology

## 2019-11-25 DIAGNOSIS — E119 Type 2 diabetes mellitus without complications: Secondary | ICD-10-CM | POA: Insufficient documentation

## 2019-11-25 DIAGNOSIS — I10 Essential (primary) hypertension: Secondary | ICD-10-CM | POA: Insufficient documentation

## 2019-12-03 ENCOUNTER — Ambulatory Visit: Payer: Medicare Other | Admitting: Cardiology

## 2019-12-31 ENCOUNTER — Telehealth: Payer: Self-pay | Admitting: Cardiology

## 2019-12-31 NOTE — Telephone Encounter (Signed)
PRE-PROCEDURE CARDIAC RISK ASSESSMENT  12/31/19  Patient's name: Mark Combs.   MRN: [id number].    DOB: [birth year]  Primary care provider: Jilda Panda, MD. Referral provider: Dr. Alfonzo Feller Biber is a 69 y.o. male for which I have been requested to assess estimated cardiac risk for planned non-cardiac procedure.  Patient's gastroenterologist reached out to the office for the upcoming procedural risk stratification for Mark Combs as he has underlying anemia and is planning to undergo EGD and colonoscopy tomorrow 01/01/2020.  I called the patient over the phone to discuss his symptoms and recent cardiovascular testing that was performed in April 2021.  Patient denies any chest pain at rest or with effort related activities.  He denies congestive heart failure symptoms.  He has not used sublingual nitroglycerin tablets in the recent past.  Most recent echocardiogram notes a preserved left ventricular systolic function without any significant valvular heart disease.  Stress test noted normal myocardial perfusion and LVEF per gated SPECT 75%.  Patient will be undergoing low risk procedure.  He is considered low cardiac risk since he has no history of recent unstable angina pectoris, no recent myocardial infarction, no signs or symptoms of decompensated heart failure, no unaddressed complex dysrhythmias, no significant valvular heart disease, and recent ischemic evaluation was within normal limits.  Patient finds the preprocedural risk stratification acceptable.  This preprocedural risk assessment is a tool to assist the proceduralist in estimating the cardiac risk for the proposed upcoming noncardiac procedure.  The shared decision to proceed with procedure will be ultimately at the discretion of the patient after the risks, benefits, and alternatives have been discussed amongst the patient and his provider.  Telephone encounter: 12 minutes.  Sincerely,   Rex Kras, [place], [facility]  Pager:  [phone] Office: [phone]

## 2020-01-22 ENCOUNTER — Ambulatory Visit (HOSPITAL_COMMUNITY): Admission: RE | Admit: 2020-01-22 | Payer: Medicare Other | Source: Home / Self Care | Admitting: Gastroenterology

## 2020-01-22 ENCOUNTER — Encounter (HOSPITAL_COMMUNITY): Admission: RE | Payer: Self-pay | Source: Home / Self Care

## 2020-01-22 SURGERY — IMAGING PROCEDURE, GI TRACT, INTRALUMINAL, VIA CAPSULE

## 2020-02-15 ENCOUNTER — Other Ambulatory Visit: Payer: Self-pay | Admitting: Cardiology

## 2020-05-06 ENCOUNTER — Other Ambulatory Visit: Payer: Self-pay | Admitting: Cardiology

## 2020-05-06 DIAGNOSIS — I209 Angina pectoris, unspecified: Secondary | ICD-10-CM

## 2020-08-20 ENCOUNTER — Emergency Department (HOSPITAL_COMMUNITY)
Admission: EM | Admit: 2020-08-20 | Discharge: 2020-08-21 | Disposition: A | Discharge status: Home / Self Care | Payer: Medicare Other | Attending: Emergency Medicine | Admitting: Emergency Medicine

## 2020-08-20 ENCOUNTER — Other Ambulatory Visit: Payer: Self-pay

## 2020-08-20 ENCOUNTER — Encounter (HOSPITAL_COMMUNITY): Payer: Self-pay

## 2020-08-20 DIAGNOSIS — Z87891 Personal history of nicotine dependence: Secondary | ICD-10-CM | POA: Diagnosis not present

## 2020-08-20 DIAGNOSIS — I10 Essential (primary) hypertension: Secondary | ICD-10-CM | POA: Diagnosis not present

## 2020-08-20 DIAGNOSIS — Z951 Presence of aortocoronary bypass graft: Secondary | ICD-10-CM | POA: Insufficient documentation

## 2020-08-20 DIAGNOSIS — Z9861 Coronary angioplasty status: Secondary | ICD-10-CM | POA: Insufficient documentation

## 2020-08-20 DIAGNOSIS — T83511A Infection and inflammatory reaction due to indwelling urethral catheter, initial encounter: Secondary | ICD-10-CM | POA: Insufficient documentation

## 2020-08-20 DIAGNOSIS — I2510 Atherosclerotic heart disease of native coronary artery without angina pectoris: Secondary | ICD-10-CM | POA: Insufficient documentation

## 2020-08-20 DIAGNOSIS — N390 Urinary tract infection, site not specified: Secondary | ICD-10-CM

## 2020-08-20 DIAGNOSIS — Z7982 Long term (current) use of aspirin: Secondary | ICD-10-CM | POA: Diagnosis not present

## 2020-08-20 DIAGNOSIS — E119 Type 2 diabetes mellitus without complications: Secondary | ICD-10-CM | POA: Insufficient documentation

## 2020-08-20 DIAGNOSIS — R509 Fever, unspecified: Secondary | ICD-10-CM | POA: Diagnosis present

## 2020-08-20 DIAGNOSIS — Z7984 Long term (current) use of oral hypoglycemic drugs: Secondary | ICD-10-CM | POA: Diagnosis not present

## 2020-08-20 DIAGNOSIS — Z79899 Other long term (current) drug therapy: Secondary | ICD-10-CM | POA: Diagnosis not present

## 2020-08-20 DIAGNOSIS — Z20822 Contact with and (suspected) exposure to covid-19: Secondary | ICD-10-CM | POA: Insufficient documentation

## 2020-08-20 NOTE — ED Triage Notes (Signed)
Patient reports fever starting tonight, states some L sided back pain as well

## 2020-08-21 ENCOUNTER — Emergency Department (HOSPITAL_COMMUNITY): Payer: Medicare Other

## 2020-08-21 LAB — COMPREHENSIVE METABOLIC PANEL
ALT: 13 U/L (ref 0–44)
AST: 17 U/L (ref 15–41)
Albumin: 3.8 g/dL (ref 3.5–5.0)
Alkaline Phosphatase: 53 U/L (ref 38–126)
Anion gap: 10 (ref 5–15)
BUN: 16 mg/dL (ref 8–23)
CO2: 20 mmol/L — ABNORMAL LOW (ref 22–32)
Calcium: 9.3 mg/dL (ref 8.9–10.3)
Chloride: 105 mmol/L (ref 98–111)
Creatinine, Ser: 1.09 mg/dL (ref 0.61–1.24)
GFR, Estimated: >60 mL/min (ref >60)
Glucose, Bld: 208 mg/dL — ABNORMAL HIGH (ref 70–99)
Potassium: 4.2 mmol/L (ref 3.5–5.1)
Sodium: 135 mmol/L (ref 135–145)
Total Bilirubin: 0.9 mg/dL (ref 0.3–1.2)
Total Protein: 7.3 g/dL (ref 6.5–8.1)

## 2020-08-21 LAB — CBC WITH DIFFERENTIAL/PLATELET
Abs Immature Granulocytes: 0.06 10*3/uL (ref 0.00–0.07)
Basophils Absolute: 0 10*3/uL (ref 0.0–0.1)
Basophils Relative: 0 %
Eosinophils Absolute: 0 10*3/uL (ref 0.0–0.5)
Eosinophils Relative: 0 %
HCT: 36.1 % — ABNORMAL LOW (ref 39.0–52.0)
Hemoglobin: 11.3 g/dL — ABNORMAL LOW (ref 13.0–17.0)
Immature Granulocytes: 0 %
Lymphocytes Relative: 9 %
Lymphs Abs: 1.3 10*3/uL (ref 0.7–4.0)
MCH: 24.8 pg — ABNORMAL LOW (ref 26.0–34.0)
MCHC: 31.3 g/dL (ref 30.0–36.0)
MCV: 79.3 fL — ABNORMAL LOW (ref 80.0–100.0)
Monocytes Absolute: 1.2 10*3/uL — ABNORMAL HIGH (ref 0.1–1.0)
Monocytes Relative: 8 %
Neutro Abs: 12.7 10*3/uL — ABNORMAL HIGH (ref 1.7–7.7)
Neutrophils Relative %: 83 %
Platelets: 235 10*3/uL (ref 150–400)
RBC: 4.55 MIL/uL (ref 4.22–5.81)
RDW: 16.3 % — ABNORMAL HIGH (ref 11.5–15.5)
WBC: 15.3 10*3/uL — ABNORMAL HIGH (ref 4.0–10.5)
nRBC: 0 % (ref 0.0–0.2)

## 2020-08-21 LAB — URINALYSIS, MICROSCOPIC (REFLEX)

## 2020-08-21 LAB — URINALYSIS, ROUTINE W REFLEX MICROSCOPIC
Bilirubin Urine: NEGATIVE
Glucose, UA: >=500 mg/dL — AB
Ketones, ur: 40 mg/dL — AB
Leukocytes,Ua: NEGATIVE
Nitrite: POSITIVE — AB
Protein, ur: NEGATIVE mg/dL
Specific Gravity, Urine: 1.025 (ref 1.005–1.030)
pH: 5 (ref 5.0–8.0)

## 2020-08-21 LAB — PROTIME-INR
INR: 1.1 (ref 0.8–1.2)
Prothrombin Time: 13.7 seconds (ref 11.4–15.2)

## 2020-08-21 LAB — RESP PANEL BY RT-PCR (FLU A&B, COVID) ARPGX2
Influenza A by PCR: NEGATIVE
Influenza B by PCR: NEGATIVE
SARS Coronavirus 2 by RT PCR: NEGATIVE

## 2020-08-21 LAB — LACTIC ACID, PLASMA: Lactic Acid, Venous: 1.2 mmol/L (ref 0.5–1.9)

## 2020-08-21 MED ORDER — SODIUM CHLORIDE 0.9 % IV SOLN
1.0000 g | Freq: Once | INTRAVENOUS | Status: AC
  Administered 2020-08-21: 1 g via INTRAVENOUS
  Filled 2020-08-21: qty 10

## 2020-08-21 MED ORDER — SODIUM CHLORIDE 0.9 % IV BOLUS
1000.0000 mL | Freq: Once | INTRAVENOUS | Status: AC
  Administered 2020-08-21: 1000 mL via INTRAVENOUS

## 2020-08-21 MED ORDER — CEPHALEXIN 500 MG PO CAPS: 500.0000 mg | ORAL_CAPSULE | Freq: Three times a day (TID) | ORAL | 0 refills | Status: DC

## 2020-08-21 MED ORDER — LORAZEPAM 2 MG/ML IJ SOLN: 0.5000 mg | Freq: Once | INTRAMUSCULAR | Status: DC

## 2020-08-21 MED ORDER — ACETAMINOPHEN 500 MG PO TABS
1000.0000 mg | ORAL_TABLET | Freq: Once | ORAL | Status: AC
  Administered 2020-08-21: 1000 mg via ORAL
  Filled 2020-08-21: qty 2

## 2020-08-21 NOTE — Discharge Instructions (Addendum)
Begin taking Keflex as prescribed.  Drink plenty of fluids and get plenty of rest.  Take Tylenol 1000 mg every 6 hours as needed for fever.  Return to the emergency department if your symptoms significantly worsen or change.

## 2020-08-21 NOTE — ED Provider Notes (Signed)
[facility] EMERGENCY DEPARTMENT Provider Note   CSN: [id number] Arrival date & time: 08/20/20  2335     History Chief Complaint  Patient presents with  . Fever    Mark Combs is a 70 y.o. male.  Patient is a 70 year old male with past medical history of diabetes, hypertension, hyperlipidemia, GERD, and hydrocele.  Patient recently seen by primary doctor for difficulty urinating.  He had a Foley catheter placed and is currently indwelling.  He believes he has a history of BPH and they believe that this is the cause.  He presents today with complaints of fever.  He also describes discomfort in his left flank.  Fever started this evening.  He denies any chills.  He denies any shortness of breath, chest pain, or cough, or other symptoms that would explain the fever.  The history is provided by the patient.       Past Medical History:  Diagnosis Date  . Anemia   . Angina pectoris ([place])   . Carotid bruit   . Chest pain   . Coronary artery disease   . Diabetes mellitus without complication ([place])   . GERD (gastroesophageal reflux disease)   . Hydrocele   . Hyperlipidemia   . Hypertension     Patient Active Problem List   Diagnosis Date Noted  . 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 Atherosclerosis of native coronary artery of native heart without angina pectoris 06/03/2018  . Uncontrolled type 2 diabetes mellitus ([place]) 06/03/2018  . Carotid bruit     Past Surgical History:  Procedure Laterality Date  . CARDIAC CATHETERIZATION    . CORONARY ARTERY BYPASS GRAFT    . HERNIA REPAIR         Family History  Problem Relation Age of Onset  . Diabetes Sister   . Heart disease Brother     Social History   Tobacco Use  . Smoking status: Former Smoker    Packs/day: 0.25    Years: 41.00    Pack years: 10.25    Types: Cigarettes    Quit date: 07/2018    Years since quitting: 2.0  . Smokeless tobacco: Never Used  Vaping Use  .  Vaping Use: Never used  Substance Use Topics  . Alcohol use: Yes    Comment: scotch  . Drug use: Never    Home Medications Prior to Admission medications   Medication Sig Start Date End Date Taking? Authorizing Provider  acetaminophen (TYLENOL) 650 MG CR tablet Take 1,950 mg by mouth daily.    [provider]  aspirin EC 81 MG tablet Take 81 mg by mouth at bedtime.     [provider]  atorvastatin (LIPITOR) 40 MG tablet Take 40 mg by mouth at bedtime.     [provider]  dapagliflozin propanediol (FARXIGA) 10 MG TABS tablet Take 10 mg by mouth at bedtime.     [provider]  isosorbide mononitrate (IMDUR) 30 MG 24 hr tablet TAKE 1 TABLET (30 MG TOTAL) BY MOUTH IN THE MORNING. 05/06/20 08/04/20  Tolia, Sunit, DO  metFORMIN (GLUCOPHAGE) 500 MG tablet Take 500 mg by mouth 2 (two) times daily. 05/13/19   [provider]  metoprolol succinate (TOPROL-XL) 25 MG 24 hr tablet Take 25 mg by mouth 2 (two) times daily.     [provider]  nitroGLYCERIN (NITROSTAT) 0.4 MG SL tablet Place 1 tablet (0.4 mg total) under the tongue every 5 (five) minutes as needed  for chest pain. 08/03/19   Tolia, Sunit, DO  olmesartan (BENICAR) 20 MG tablet TAKE 1 TABLET (20 MG TOTAL) BY MOUTH DAILY. 02/15/20   Tolia, Sunit, DO  omeprazole (PRILOSEC) 40 MG capsule Take 40 mg by mouth at bedtime.    [provider]  RYBELSUS 7 MG TABS Take 1 tablet by mouth daily. 12/02/19   [provider]  tamsulosin (FLOMAX) 0.4 MG CAPS capsule Take 0.4 mg by mouth at bedtime.     [provider]    Allergies    Patient has no known allergies.  Review of Systems   Review of Systems  All other systems reviewed and are negative.   Physical Exam Updated Vital Signs BP (!) 158/95 (BP Location: Right Arm)   Pulse (!) 136   Temp (!) 101.4 F (38.6 C) (Oral)   Resp 16   Ht 5' 6.5" (1.689 m)   Wt 70.3 kg   SpO2 96%   BMI 24.64 kg/m   Physical  Exam Vitals and nursing note reviewed.  Constitutional:      General: He is not in acute distress.    Appearance: He is well-developed. He is not diaphoretic.  HENT:     Head: Normocephalic and atraumatic.  Cardiovascular:     Rate and Rhythm: Normal rate and regular rhythm.     Heart sounds: No murmur heard. No friction rub.  Pulmonary:     Effort: Pulmonary effort is normal. No respiratory distress.     Breath sounds: Normal breath sounds. No wheezing or rales.  Abdominal:     General: Bowel sounds are normal. There is no distension.     Palpations: Abdomen is soft.     Tenderness: There is no abdominal tenderness. There is left CVA tenderness. There is no right CVA tenderness, guarding or rebound.  Musculoskeletal:        General: Normal range of motion.     Cervical back: Normal range of motion and neck supple.  Skin:    General: Skin is warm and dry.  Neurological:     Mental Status: He is alert and oriented to person, place, and time.     Coordination: Coordination normal.     ED Results / Procedures / Treatments   Labs (all labs ordered are listed, but only abnormal results are displayed) Labs Reviewed  CULTURE, BLOOD (ROUTINE X 2)  CULTURE, BLOOD (ROUTINE X 2)  RESP PANEL BY RT-PCR (FLU A&B, COVID) ARPGX2  LACTIC ACID, PLASMA  LACTIC ACID, PLASMA  COMPREHENSIVE METABOLIC PANEL  CBC WITH DIFFERENTIAL/PLATELET  URINALYSIS, ROUTINE W REFLEX MICROSCOPIC  PROTIME-INR    EKG None  Radiology No results found.  Procedures Procedures   Medications Ordered in ED Medications  sodium chloride 0.9 % bolus 1,000 mL (has no administration in time range)  acetaminophen (TYLENOL) tablet 1,000 mg (has no administration in time range)    ED Course  I have reviewed the triage vital signs and the nursing notes.  Pertinent labs & imaging results that were available during my care of the patient were reviewed by me and considered in my medical decision making (see chart  for details).    MDM Rules/Calculators/A&P  Patient is a 70 year old male with past medical history as per HPI presenting with complaints of fever.  He had recent Foley catheter placement due to urinary retention and catheter is indwelling.  Patient initially arrives here with a temp of 101.4, however has since defervesced after receiving Tylenol.  Patient's work-up  reveals a white count of 15,000, but is otherwise unremarkable.  I did obtain a CT scan due to the urinary retention to make sure there was not a kidney stone or another possible source of infection.  This was negative.  Patient also negative for COVID and flu  At this point, his fever appears to be coming from a urinary tract infection likely from the Foley catheter.  He will be given IV Rocephin, then discharged with Keflex.  He does not appear septic.  Vitals are stable and I believe he is appropriate for discharge.  He will be advised to follow-up with primary doctor in the next few days and return if symptoms worsen.  Final Clinical Impression(s) / ED Diagnoses Final diagnoses:  None    Rx / DC Orders ED Discharge Orders    None       Veryl Speak, MD 08/21/20 [phone]

## 2020-08-21 NOTE — ED Notes (Signed)
Patient transported to CT 

## 2020-08-23 ENCOUNTER — Other Ambulatory Visit: Payer: Self-pay | Admitting: Cardiology

## 2020-08-23 DIAGNOSIS — I209 Angina pectoris, unspecified: Secondary | ICD-10-CM

## 2020-08-23 DIAGNOSIS — I25119 Atherosclerotic heart disease of native coronary artery with unspecified angina pectoris: Secondary | ICD-10-CM

## 2020-08-23 DIAGNOSIS — Z951 Presence of aortocoronary bypass graft: Secondary | ICD-10-CM

## 2020-08-26 LAB — CULTURE, BLOOD (ROUTINE X 2)
Culture: NO GROWTH
Culture: NO GROWTH
Special Requests: ADEQUATE
Special Requests: ADEQUATE

## 2020-09-20 ENCOUNTER — Other Ambulatory Visit: Payer: Self-pay | Admitting: Cardiology

## 2020-10-10 ENCOUNTER — Ambulatory Visit
Admission: RE | Admit: 2020-10-10 | Discharge: 2020-10-10 | Disposition: A | Discharge status: Home / Self Care | Payer: Medicare Other | Source: Ambulatory Visit | Attending: Internal Medicine | Admitting: Internal Medicine

## 2020-10-10 ENCOUNTER — Other Ambulatory Visit: Payer: Self-pay

## 2020-10-10 ENCOUNTER — Other Ambulatory Visit: Payer: Self-pay | Admitting: Internal Medicine

## 2020-10-10 DIAGNOSIS — R932 Abnormal findings on diagnostic imaging of liver and biliary tract: Secondary | ICD-10-CM

## 2020-10-26 ENCOUNTER — Other Ambulatory Visit: Payer: Self-pay

## 2020-10-26 ENCOUNTER — Ambulatory Visit (INDEPENDENT_AMBULATORY_CARE_PROVIDER_SITE_OTHER): Payer: Medicare Other | Admitting: Internal Medicine

## 2020-10-26 ENCOUNTER — Encounter: Payer: Self-pay | Admitting: Internal Medicine

## 2020-10-26 DIAGNOSIS — A064 Amebic liver abscess: Secondary | ICD-10-CM

## 2020-10-26 DIAGNOSIS — I209 Angina pectoris, unspecified: Secondary | ICD-10-CM

## 2020-10-26 DIAGNOSIS — Z87898 Personal history of other specified conditions: Secondary | ICD-10-CM

## 2020-10-26 DIAGNOSIS — M549 Dorsalgia, unspecified: Secondary | ICD-10-CM

## 2020-10-26 DIAGNOSIS — K8020 Calculus of gallbladder without cholecystitis without obstruction: Secondary | ICD-10-CM

## 2020-10-26 NOTE — Progress Notes (Signed)
Mark Combs for Infectious Disease  Reason for Consult: Calcified liver cyst Referring Provider: Dr. 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  The calcified cyst in his left liver is a benign remnant of his infection.  He does not need any further work-up or treatment.   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   ASPIRIN EC 81 MG TABLET    Take 81 mg by mouth at bedtime.    ATORVASTATIN (LIPITOR) 40 MG TABLET    Take 40 mg by mouth at bedtime.    CEPHALEXIN (KEFLEX) 500 MG CAPSULE    Take 1 capsule (500 mg total) by mouth 3 (three) times daily.   DAPAGLIFLOZIN PROPANEDIOL (FARXIGA) 10 MG TABS TABLET    Take 10 mg by mouth at bedtime.    FINASTERIDE (PROSCAR) 5 MG TABLET    Take 5 mg by mouth daily.   ISOSORBIDE MONONITRATE (IMDUR) 30 MG 24 HR TABLET    TAKE 1 TABLET (30 MG TOTAL) BY MOUTH IN THE MORNING.   METFORMIN (GLUCOPHAGE) 500 MG TABLET    Take 500 mg by mouth 2 (two) times daily.   METOPROLOL SUCCINATE (TOPROL-XL) 25 MG 24 HR TABLET    Take 25 mg by mouth 2 (two) times daily.    NITROGLYCERIN (NITROSTAT) 0.4 MG SL TABLET    Place 1 tablet (0.4 mg total) under the tongue every 5 (five) minutes as needed for chest pain.   OLMESARTAN (BENICAR) 20 MG TABLET    TAKE 1 TABLET (20 MG TOTAL) BY MOUTH DAILY.   OMEPRAZOLE (PRILOSEC) 40 MG CAPSULE    Take 40 mg by mouth at bedtime.   SEMAGLUTIDE 14 MG TABS    Take 14 mg by mouth daily.   TAMSULOSIN (FLOMAX)  0.4 MG CAPS CAPSULE    Take 0.4 mg by mouth at bedtime.   Modified Medications   No medications on file  Discontinued Medications   No medications on file    HPI: Mark Combs is a 70 y.o. male who recently had urinary retention fever and left-sided flank pain.  He underwent a CT scan on 08/21/2020 which showed a calcified cyst in the left lobe of his liver and incidental gallstones.  He had a Foley catheter placed for a brief period of time and was treated for a urinary tract infection and his fever and flank pain resolved.  He recalls that 40 years ago, while living in [place], he developed fever and was hospitalized.  He was eventually told that he had an amoebic liver abscess and was treated with metronidazole.  His fevers resolved quickly.  Years later when he immigrated to [place] he had a doctor there check him out.  He says that he had blood work and imaging and was told that everything was "good" and that he only had a "shell" remaining in his liver.  He says that he is feeling well now  other than transient lower back discomfort when he first gets up in the morning.  He has not had any more fever.  He has not had any abdominal pain or diarrhea.  Review of Systems: Review of Systems  Constitutional:  Negative for chills, diaphoresis, fever and weight loss.  Gastrointestinal:  Negative for abdominal pain, diarrhea, nausea and vomiting.  Genitourinary:  Negative for dysuria.  Musculoskeletal:  Positive for back pain.     Past Medical History:  Diagnosis Date   Anemia    Angina pectoris ([facility])    Carotid bruit    Chest pain    Coronary artery disease    Diabetes mellitus without complication ([facility])    GERD (gastroesophageal reflux disease)    Hydrocele    Hyperlipidemia    Hypertension     Social History   Tobacco Use   Smoking status: Former    Packs/day: 0.25    Years: 41.00    Pack years: 10.25    Types: Cigarettes    Quit date: 07/2018    Years since  quitting: 2.2   Smokeless tobacco: Never  Vaping Use   Vaping Use: Never used  Substance Use Topics   Alcohol use: Yes    Comment: scotch   Drug use: Never    Family History  Problem Relation Age of Onset   Diabetes Sister    Heart disease Brother    No Known Allergies  OBJECTIVE: Vitals:   10/26/20 1001  Weight: 156 lb 3.2 oz (70.9 kg)   Body mass index is 24.83 kg/m.   Physical Exam Constitutional:      Comments: He is very pleasant.  Cardiovascular:     Rate and Rhythm: Normal rate.  Pulmonary:     Effort: Pulmonary effort is normal.  Abdominal:     Palpations: Abdomen is soft. There is no mass.     Tenderness: There is no abdominal tenderness.  Psychiatric:        Mood and Affect: Mood normal.    CT renal stone study 08/21/2020  IMPRESSION: 1. 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 MRI correlation is recommended. 2. Cholelithiasis. 3. 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 4. Heterogeneous material within the bladder lumen which may represent debris versus a small amount of blood products. 5. Enlarged prostate gland.    Microbiology: No results found for this or any previous visit (from the past 240 hour(s)).  Mark Bickers, MD [facility] [phone] pager   [phone] cell 10/26/2020, 10:03 AM

## 2020-10-26 NOTE — Assessment & Plan Note (Signed)
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  The calcified cyst in his left liver is a benign remnant of his infection.  He does not need any further work-up or treatment.

## 2020-12-03 ENCOUNTER — Other Ambulatory Visit: Payer: Self-pay | Admitting: Cardiology

## 2020-12-03 DIAGNOSIS — I209 Angina pectoris, unspecified: Secondary | ICD-10-CM

## 2020-12-03 DIAGNOSIS — Z951 Presence of aortocoronary bypass graft: Secondary | ICD-10-CM

## 2020-12-03 DIAGNOSIS — I25119 Atherosclerotic heart disease of native coronary artery with unspecified angina pectoris: Secondary | ICD-10-CM

## 2020-12-05 ENCOUNTER — Telehealth: Payer: Self-pay | Admitting: Cardiology

## 2020-12-05 ENCOUNTER — Other Ambulatory Visit: Payer: Self-pay

## 2020-12-05 DIAGNOSIS — I25119 Atherosclerotic heart disease of native coronary artery with unspecified angina pectoris: Secondary | ICD-10-CM

## 2020-12-05 DIAGNOSIS — I209 Angina pectoris, unspecified: Secondary | ICD-10-CM

## 2020-12-05 DIAGNOSIS — Z951 Presence of aortocoronary bypass graft: Secondary | ICD-10-CM

## 2020-12-05 MED ORDER — NITROGLYCERIN 0.4 MG SL SUBL: 0.4000 mg | SUBLINGUAL_TABLET | SUBLINGUAL | 3 refills | Status: DC | PRN

## 2020-12-05 NOTE — Telephone Encounter (Signed)
Sent!

## 2020-12-05 NOTE — Telephone Encounter (Signed)
Tyson Alias w/[facility] called on behalf of Pt. Contact info provided via phone call is [phone]. Called to refill prescription for Pt - nitroglycerin 0.4 mg.

## 2021-02-07 ENCOUNTER — Other Ambulatory Visit: Payer: Self-pay | Admitting: Cardiology

## 2021-02-07 DIAGNOSIS — I209 Angina pectoris, unspecified: Secondary | ICD-10-CM

## 2021-02-22 ENCOUNTER — Ambulatory Visit: Payer: Medicare Other | Admitting: Internal Medicine

## 2021-03-01 ENCOUNTER — Ambulatory Visit: Payer: Medicare Other | Admitting: Urology

## 2021-03-01 NOTE — Progress Notes (Deleted)
   Assessment: No diagnosis found.   Plan: ***  Chief Complaint: No chief complaint on file.   History of Present Illness:  Mark Combs is a 69 y.o. year old male who is seen in consultation from Jilda Panda, MD  for evaluation of ***.   Past Medical History:  Past Medical History:  Diagnosis Date   Anemia    Angina pectoris ([facility])    Carotid bruit    Chest pain    Coronary artery disease    Diabetes mellitus without complication ([facility])    GERD (gastroesophageal reflux disease)    Hydrocele    Hyperlipidemia    Hypertension     Past Surgical History:  Past Surgical History:  Procedure Laterality Date   CARDIAC CATHETERIZATION     CORONARY ARTERY BYPASS GRAFT     HERNIA REPAIR      Allergies:  No Known Allergies  Family History:  Family History  Problem Relation Age of Onset   Diabetes Sister    Heart disease Brother     Social History:  Social History   Tobacco Use   Smoking status: Former    Packs/day: 0.25    Years: 41.00    Pack years: 10.25    Types: Cigarettes    Quit date: 07/2018    Years since quitting: 2.6   Smokeless tobacco: Never  Vaping Use   Vaping Use: Never used  Substance Use Topics   Alcohol use: Yes    Comment: scotch   Drug use: Never    Review of symptoms:  Constitutional:  Negative for unexplained weight loss, night sweats, fever, chills ENT:  Negative for nose bleeds, sinus pain, painful swallowing CV:  Negative for chest pain, shortness of breath, exercise intolerance, palpitations, loss of consciousness Resp:  Negative for cough, wheezing, shortness of breath GI:  Negative for nausea, vomiting, diarrhea, bloody stools GU:  Positives noted in HPI; otherwise negative for gross hematuria, dysuria, urinary incontinence Neuro:  Negative for seizures, poor balance, limb weakness, slurred speech Psych:  Negative for lack of energy, depression, anxiety Endocrine:  Negative for polydipsia, polyuria, symptoms of  hypoglycemia (dizziness, hunger, sweating) Hematologic:  Negative for anemia, purpura, petechia, prolonged or excessive bleeding, use of anticoagulants  Allergic:  Negative for difficulty breathing or choking as a result of exposure to anything; no shellfish allergy; no allergic response (rash/itch) to materials, foods  Physical exam: There were no vitals taken for this visit. GENERAL APPEARANCE:  Well appearing, well developed, well nourished, NAD HEENT: Atraumatic, Normocephalic, oropharynx clear. NECK: Supple without lymphadenopathy or thyromegaly. LUNGS: Clear to auscultation bilaterally. HEART: Regular Rate and Rhythm without murmurs, gallops, or rubs. ABDOMEN: Soft, non-tender, No Masses. EXTREMITIES: Moves all extremities well.  Without clubbing, cyanosis, or edema. NEUROLOGIC:  Alert and oriented x 3, normal gait, CN II-XII grossly intact.  MENTAL STATUS:  Appropriate. BACK:  Non-tender to palpation.  No CVAT SKIN:  Warm, dry and intact.    Results: No results found for this or any previous visit (from the past 24 hour(s)).

## 2021-05-02 ENCOUNTER — Other Ambulatory Visit: Payer: Self-pay | Admitting: Cardiology

## 2021-05-02 DIAGNOSIS — I209 Angina pectoris, unspecified: Secondary | ICD-10-CM

## 2021-05-24 ENCOUNTER — Encounter: Payer: Self-pay | Admitting: Cardiology

## 2021-05-24 ENCOUNTER — Other Ambulatory Visit: Payer: Self-pay

## 2021-05-24 ENCOUNTER — Ambulatory Visit: Payer: Medicare Other | Admitting: Cardiology

## 2021-05-24 VITALS — BP 147/83 | HR 90 | Temp 97.7°F | Resp 16 | Ht 66.0 in | Wt 141.0 lb

## 2021-05-24 DIAGNOSIS — Z87891 Personal history of nicotine dependence: Secondary | ICD-10-CM

## 2021-05-24 DIAGNOSIS — I10 Essential (primary) hypertension: Secondary | ICD-10-CM

## 2021-05-24 DIAGNOSIS — Z951 Presence of aortocoronary bypass graft: Secondary | ICD-10-CM

## 2021-05-24 DIAGNOSIS — E785 Hyperlipidemia, unspecified: Secondary | ICD-10-CM

## 2021-05-24 DIAGNOSIS — E1159 Type 2 diabetes mellitus with other circulatory complications: Secondary | ICD-10-CM

## 2021-05-24 DIAGNOSIS — I2510 Atherosclerotic heart disease of native coronary artery without angina pectoris: Secondary | ICD-10-CM

## 2021-05-24 NOTE — Progress Notes (Signed)
[facility]bs Date of Birth: 09-24-1950 MRN: [id number] Primary Care Provider:Hyler, Gwen Her, NP  Date: 05/24/21 Last Office Visit: August 03, 2019.  Chief Complaint  Patient presents with   Coronary Artery Disease   Follow-up    HPI  [facility]bs is a 71 y.o.  male who presents to the office with a chief complaint of " chest pain." Patient's past medical history and cardiovascular risk factors include: hypertension, non-insulin-dependent diabetes mellitus type 2, hyperlipidemia, former tobacco use, CAD s/p CABG x4 in [place], [place] in 2012.  Patient was last seen in the office back in 2021 and now presents for a walk-in visit today.  He does not have any active chest pain or heart failure symptoms.  Since last office visit patient states that he is doing well from a cardiovascular standpoint.  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  He uses sublingual nitroglycerin tablets on as needed basis approximately once every 3 or 4 months.  He requires no more than 1 tablet for symptom relief.  The chest pain that he experiences during those moments do not appear to be completely cardiac in nature.  Patient's blood pressure at today's office visit is currently not at goal.  He does not check blood pressures at home.  Despite being a diabetic and hyperlipidemia and CAD no recent labs are available for review.  He is in the process of establishing care with a new PCP and plans to have labs on June 20, 2021.  FUNCTIONAL STATUS: No structured exercise program or daily routine.  ALLERGIES: No Known Allergies  MEDICATION LIST PRIOR TO VISIT: Current Outpatient Medications on File Prior to Visit  Medication Sig Dispense Refill   acetaminophen (TYLENOL) 650 MG CR tablet Take 1,950 mg by mouth every 8 (eight) hours as needed.     aspirin EC 81 MG tablet Take 81 mg by mouth at bedtime.       atorvastatin (LIPITOR) 40 MG tablet Take 40 mg by mouth daily.     dapagliflozin propanediol (FARXIGA) 10 MG TABS tablet Take 10 mg by mouth at bedtime.      finasteride (PROSCAR) 5 MG tablet Take 5 mg by mouth daily.     isosorbide mononitrate (IMDUR) 30 MG 24 hr tablet TAKE 1 TABLET (30 MG TOTAL) BY MOUTH IN THE MORNING. 90 tablet 3   metFORMIN (GLUCOPHAGE) 500 MG tablet Take 500 mg by mouth 2 (two) times daily.     metoprolol succinate (TOPROL-XL) 25 MG 24 hr tablet Take 25 mg by mouth 2 (two) times daily.      nitroGLYCERIN (NITROSTAT) 0.4 MG SL tablet Place 1 tablet (0.4 mg total) under the tongue every 5 (five) minutes as needed for chest pain. 25 tablet 3   olmesartan (BENICAR) 20 MG tablet TAKE 1 TABLET (20 MG TOTAL) BY MOUTH DAILY. 30 tablet 6   omeprazole (PRILOSEC) 40 MG capsule Take 40 mg by mouth at bedtime.     Semaglutide (RYBELSUS) 7 MG TABS Take by mouth.     tamsulosin (FLOMAX) 0.4 MG CAPS capsule Take 0.4 mg by mouth at bedtime.      No current facility-administered medications on file prior to visit.    PAST MEDICAL HISTORY: Past Medical History:  Diagnosis Date   Anemia    Angina pectoris (HCC)    Carotid bruit    Chest pain  Coronary artery disease    Diabetes mellitus without complication (HCC)    GERD (gastroesophageal reflux disease)    Hydrocele    Hyperlipidemia    Hypertension     PAST SURGICAL HISTORY: Past Surgical History:  Procedure Laterality Date   CARDIAC CATHETERIZATION     CORONARY ARTERY BYPASS GRAFT     HERNIA REPAIR      FAMILY HISTORY: The patient's family history includes Diabetes in his sister; Heart disease in his brother.   SOCIAL HISTORY:  The patient  reports that he has been smoking cigarettes. He has a 20.50 pack-year smoking history. He has never used smokeless tobacco. He reports current alcohol use. He reports that he does not use drugs.  Review of Systems  Constitutional: Negative for chills and fever.  HENT:   Negative for ear discharge, ear pain and nosebleeds.   Eyes:  Negative for blurred vision and discharge.  Cardiovascular:  Negative for chest pain, claudication, dyspnea on exertion, leg swelling, near-syncope, orthopnea, palpitations, paroxysmal nocturnal dyspnea and syncope.  Respiratory:  Negative for cough and shortness of breath.   Endocrine: Negative for polydipsia, polyphagia and polyuria.  Hematologic/Lymphatic: Negative for bleeding problem.  Skin:  Negative for flushing and nail changes.  Musculoskeletal:  Negative for muscle cramps, muscle weakness and myalgias.  Gastrointestinal:  Negative for abdominal pain, dysphagia, hematemesis, hematochezia, melena, nausea and vomiting.  Neurological:  Negative for dizziness, focal weakness and light-headedness.   PHYSICAL EXAM: Vitals with BMI 05/24/2021 10/26/2020 10/26/2020  Height 5\' 6"  - -  Weight 141 lbs 154 lbs 156 lbs 3 oz  BMI 69.62 - -  Systolic 952 841 -  Diastolic 83 96 -  Pulse 90 96 -    CONSTITUTIONAL: Well-developed and well-nourished. No acute distress.  SKIN: Skin is warm and dry. No rash noted. No cyanosis. No pallor. No jaundice HEAD: Normocephalic and atraumatic.  EYES: No scleral icterus MOUTH/THROAT: Moist oral membranes.  NECK: No JVD present. No thyromegaly noted. No carotid bruits  LYMPHATIC: No visible cervical adenopathy.  CHEST Normal respiratory effort. No intercostal retractions.  Sternotomy site is well-healed. LUNGS: Clear to auscultation bilaterally.  No stridor. No wheezes. No rales.  CARDIOVASCULAR: Regular rate and rhythm, positive S1-S2, no murmurs rubs or gallops appreciated ABDOMINAL: soft, nontender, nondistended, positive bowel sounds in all 4 quadrants, no apparent ascites.  EXTREMITIES: No bilateral peripheral edema, warm to touch.  2+ DP and PT pulses. HEMATOLOGIC: No significant bruising NEUROLOGIC: Oriented to person, place, and time. Nonfocal. Normal muscle tone.  PSYCHIATRIC: Normal mood  and affect. Normal behavior. Cooperative  CARDIAC DATABASE: Coronary artery bypass grafting: Four-vessel bypass (2012 in [place])  EKG: 05/24/2021: NSR, 90 bpm, without underlying ischemia or injury pattern.  Echocardiogram: 08/12/2019: Normal LV systolic function with visual EF 55-60%. Left ventricle cavity is normal in size. Mild left ventricular hypertrophy. Normal global wall motion. Normal diastolic filling pattern, normal LAP. Calculated EF 51%. Moderate pulmonic regurgitation. No other significant valvular abnormalities. No significant change compared to previous study dated 03/27/2018.    Stress Testing:  Lexiscan (Walking with mod Bruce)Tetrofosmin Stress Test 08/12/2019: Nondiagnostic ECG stress. Myocardial perfusion is normal. Overall LV systolic function is normal without regional wall motion abnormalities. Stress LV EF: 75%.  No previous exam available for comparison. Low risk.   Heart Catheterization: 05/04/2010: Normal LV systolic function, mid LAD 95% stenosis,  D1 tandem 75% stenosis, proximal circumflex 95% stenosis and extends into large OM1.  Mid RCA 75% stenosis.  Normal LVEF.  Carotid duplex: 03/27/2018: No hemodynamically significant arterial disease in the internal carotid artery bilaterally. There is minimal plaque bilateral carotids. Antegrade right vertebral artery flow. Antegrade left vertebral artery flow.  LABORATORY DATA: CBC Latest Ref Rng & Units 08/20/2020  WBC 4.0 - 10.5 K/uL 15.3(H)  Hemoglobin 13.0 - 17.0 g/dL 11.3(L)  Hematocrit 39.0 - 52.0 % 36.1(L)  Platelets 150 - 400 K/uL 235    CMP Latest Ref Rng & Units 08/20/2020  Glucose 70 - 99 mg/dL 208(H)  BUN 8 - 23 mg/dL 16  Creatinine 0.61 - 1.24 mg/dL 1.09  Sodium 135 - 145 mmol/L 135  Potassium 3.5 - 5.1 mmol/L 4.2  Chloride 98 - 111 mmol/L 105  CO2 22 - 32 mmol/L 20(L)  Calcium 8.9 - 10.3 mg/dL 9.3  Total Protein 6.5 - 8.1 g/dL 7.3  Total Bilirubin 0.3 - 1.2 mg/dL 0.9   Alkaline Phos 38 - 126 U/L 53  AST 15 - 41 U/L 17  ALT 0 - 44 U/L 13    Lipid Panel  No results found for: CHOL, TRIG, HDL, CHOLHDL, VLDL, LDLCALC, LDLDIRECT, LABVLDL  No results found for: HGBA1C No components found for: NTPROBNP No results found for: TSH  Cardiac Panel (last 3 results) No results for input(s): CKTOTAL, CKMB, TROPONINIHS, RELINDX in the last 72 hours.  FINAL MEDICATION LIST END OF ENCOUNTER: No orders of the defined types were placed in this encounter.    Current Outpatient Medications:    acetaminophen (TYLENOL) 650 MG CR tablet, Take 1,950 mg by mouth every 8 (eight) hours as needed., Disp: , Rfl:    aspirin EC 81 MG tablet, Take 81 mg by mouth at bedtime. , Disp: , Rfl:    atorvastatin (LIPITOR) 40 MG tablet, Take 40 mg by mouth daily., Disp: , Rfl:    dapagliflozin propanediol (FARXIGA) 10 MG TABS tablet, Take 10 mg by mouth at bedtime. , Disp: , Rfl:    finasteride (PROSCAR) 5 MG tablet, Take 5 mg by mouth daily., Disp: , Rfl:    isosorbide mononitrate (IMDUR) 30 MG 24 hr tablet, TAKE 1 TABLET (30 MG TOTAL) BY MOUTH IN THE MORNING., Disp: 90 tablet, Rfl: 3   metFORMIN (GLUCOPHAGE) 500 MG tablet, Take 500 mg by mouth 2 (two) times daily., Disp: , Rfl:    metoprolol succinate (TOPROL-XL) 25 MG 24 hr tablet, Take 25 mg by mouth 2 (two) times daily. , Disp: , Rfl:    nitroGLYCERIN (NITROSTAT) 0.4 MG SL tablet, Place 1 tablet (0.4 mg total) under the tongue every 5 (five) minutes as needed for chest pain., Disp: 25 tablet, Rfl: 3   olmesartan (BENICAR) 20 MG tablet, TAKE 1 TABLET (20 MG TOTAL) BY MOUTH DAILY., Disp: 30 tablet, Rfl: 6   omeprazole (PRILOSEC) 40 MG capsule, Take 40 mg by mouth at bedtime., Disp: , Rfl:    Semaglutide (RYBELSUS) 7 MG TABS, Take by mouth., Disp: , Rfl:    tamsulosin (FLOMAX) 0.4 MG CAPS capsule, Take 0.4 mg by mouth at bedtime. , Disp: , Rfl:   IMPRESSION:    ICD-10-CM   1. Atherosclerosis of native coronary artery of native heart  without angina pectoris  I25.10 EKG 12-Lead    2. History of 4 vessel coronary artery bypass graft  Z95.1     3. Benign hypertension  I10     4. Hyperlipidemia LDL goal <70  E78.5     5. Type 2 diabetes mellitus with other circulatory complication, without long-term current use of insulin (  [place])  E11.59     6. Former tobacco use  Z87.891        RECOMMENDATIONS: Mark Combs is a 71 y.o. male whose past medical history and cardiovascular risk factors include: hypertension, non-insulin-dependent type 2 diabetes, hyperlipidemia, former tobacco use, CAD s/p CABG x4 in [place], [place] in 2012.  Atherosclerosis of native coronary artery of native heart without angina pectoris (HCC)/history of four-vessel CABG No active chest pain. EKG sinus rhythm without underlying ischemia. Independently reviewed the results of the echo and stress test from April 2021 with the patient at today's office visit and noted above for further reference. Patient does require sublingual nitroglycerin tablet once every 3 months for precordial discomfort.  However his discomfort does not appear to be cardiac in etiology. He has multiple cardiovascular risk factors which need to be addressed.  He does not have any recent labs to evaluate his lipids or his A1c.  Patient states that he is establishing care with a new primary provider and has labs scheduled 06/12/2021.  I have asked him to forward a copy to the office for reference.  Benign hypertension Within acceptable range, but not at goal. Patient does not check his blood pressures at home. Patient is asked to check his blood pressures and keep a log so that it could be reviewed with either myself or his PCP. Reemphasized importance of a low-salt diet.  Hyperlipidemia LDL goal <70 Currently on atorvastatin. Patient is MAR reported both Lipitor 40 mg p.o. nightly and 80 mg p.o. nightly.  Patient thinks he is currently taking 40 mg tablets. No recent lipid profile for  review. I offered checking a fasting lipid profile; however, he would like to wait until 06/20/2021 as originally scheduled  Type 2 diabetes mellitus with other circulatory complication, without long-term current use of insulin ([place]) Patient is educated on importance of glycemic control.  He does not check his blood sugars or his recent hemoglobin A1c. Currently on metformin, Farxiga, semaglutide, statins.  Former tobacco use Educated on the importance of continued smoking cessation.    Orders Placed This Encounter  Procedures   EKG 12-Lead   --Continue cardiac medications as reconciled in final medication list. --Return in about 6 months (around 11/21/2021) for Follow up, CAD. Or sooner if needed. --Continue follow-up with your primary care physician regarding the management of your other chronic comorbid conditions.  Patient's questions and concerns were addressed to his satisfaction. He voices understanding of the instructions provided during this encounter.   This note was created using a voice recognition software as a result there may be grammatical errors inadvertently enclosed that do not reflect the nature of this encounter. Every attempt is made to correct such errors.  Rex Kras, [place], [facility]  Pager: [phone] Office: [phone]

## 2021-06-21 ENCOUNTER — Ambulatory Visit: Payer: Medicare Other | Admitting: Cardiology

## 2021-06-28 ENCOUNTER — Other Ambulatory Visit: Payer: Self-pay | Admitting: Urology

## 2021-06-28 DIAGNOSIS — C61 Malignant neoplasm of prostate: Secondary | ICD-10-CM

## 2021-07-21 ENCOUNTER — Ambulatory Visit
Admission: RE | Admit: 2021-07-21 | Discharge: 2021-07-21 | Disposition: A | Discharge status: Home / Self Care | Payer: Medicare Other | Source: Ambulatory Visit | Attending: Urology | Admitting: Urology

## 2021-07-21 DIAGNOSIS — C61 Malignant neoplasm of prostate: Secondary | ICD-10-CM

## 2021-07-21 MED ORDER — GADOBENATE DIMEGLUMINE 529 MG/ML IV SOLN
13.0000 mL | Freq: Once | INTRAVENOUS | Status: AC | PRN
  Administered 2021-07-21: 13 mL via INTRAVENOUS

## 2021-08-09 ENCOUNTER — Other Ambulatory Visit: Payer: Self-pay | Admitting: Cardiology

## 2021-08-09 ENCOUNTER — Other Ambulatory Visit: Payer: Self-pay

## 2021-08-09 DIAGNOSIS — I209 Angina pectoris, unspecified: Secondary | ICD-10-CM

## 2021-11-15 ENCOUNTER — Encounter: Payer: Self-pay | Admitting: Internal Medicine

## 2021-11-21 ENCOUNTER — Ambulatory Visit: Payer: Medicare Other | Admitting: Cardiology

## 2021-12-11 ENCOUNTER — Ambulatory Visit: Payer: Medicare Other | Admitting: Urology

## 2021-12-13 ENCOUNTER — Ambulatory Visit: Payer: Medicare Other | Admitting: Internal Medicine

## 2021-12-19 ENCOUNTER — Ambulatory Visit: Payer: Medicare Other | Admitting: Urology

## 2021-12-19 NOTE — Progress Notes (Deleted)
   Assessment: 1. Hydrocele in adult      Plan: ***  Chief Complaint: No chief complaint on file.   History of Present Illness:  Mark Combs is a 71 y.o. male who is seen in consultation from Jerene Bears B, MD for evaluation of a right hydrocele.     Past Medical History:  Past Medical History:  Diagnosis Date   Anemia    Angina pectoris (HCC)    Carotid bruit    Chest pain    Coronary artery disease    Diabetes mellitus without complication (HCC)    GERD (gastroesophageal reflux disease)    Hydrocele    Hyperlipidemia    Hypertension     Past Surgical History:  Past Surgical History:  Procedure Laterality Date   CARDIAC CATHETERIZATION     CORONARY ARTERY BYPASS GRAFT     HERNIA REPAIR      Allergies:  No Known Allergies  Family History:  Family History  Problem Relation Age of Onset   Diabetes Sister    Heart disease Brother     Social History:  Social History   Tobacco Use   Smoking status: Every Day    Packs/day: 0.50    Years: 41.00    Total pack years: 20.50    Types: Cigarettes   Smokeless tobacco: Never  Vaping Use   Vaping Use: Never used  Substance Use Topics   Alcohol use: Yes    Comment: scotch   Drug use: Never    Review of symptoms:  Constitutional:  Negative for unexplained weight loss, night sweats, fever, chills ENT:  Negative for nose bleeds, sinus pain, painful swallowing CV:  Negative for chest pain, shortness of breath, exercise intolerance, palpitations, loss of consciousness Resp:  Negative for cough, wheezing, shortness of breath GI:  Negative for nausea, vomiting, diarrhea, bloody stools GU:  Positives noted in HPI; otherwise negative for gross hematuria, dysuria, urinary incontinence Neuro:  Negative for seizures, poor balance, limb weakness, slurred speech Psych:  Negative for lack of energy, depression, anxiety Endocrine:  Negative for polydipsia, polyuria, symptoms of hypoglycemia (dizziness, hunger,  sweating) Hematologic:  Negative for anemia, purpura, petechia, prolonged or excessive bleeding, use of anticoagulants  Allergic:  Negative for difficulty breathing or choking as a result of exposure to anything; no shellfish allergy; no allergic response (rash/itch) to materials, foods  Physical exam: There were no vitals taken for this visit. GENERAL APPEARANCE:  Well appearing, well developed, well nourished, NAD HEENT: Atraumatic, Normocephalic, oropharynx clear. NECK: Supple without lymphadenopathy or thyromegaly. LUNGS: Clear to auscultation bilaterally. HEART: Regular Rate and Rhythm without murmurs, gallops, or rubs. ABDOMEN: Soft, non-tender, No Masses. EXTREMITIES: Moves all extremities well.  Without clubbing, cyanosis, or edema. NEUROLOGIC:  Alert and oriented x 3, normal gait, CN II-XII grossly intact.  MENTAL STATUS:  Appropriate. BACK:  Non-tender to palpation.  No CVAT SKIN:  Warm, dry and intact.   GU: Penis:  {Exam; penis:5791} Meatus: {Meatus:15530} Scrotum: {pe scrotum:310183} Testis: {Exam; testicles:5790} Epididymis: {epididymis WNUU:725366} Prostate: {Exam; prostate:5793} Rectum: {rectal exam:26517}   Results: No results found for this or any previous visit (from the past 24 hour(s)).

## 2022-02-23 DIAGNOSIS — F17210 Nicotine dependence, cigarettes, uncomplicated: Secondary | ICD-10-CM | POA: Diagnosis not present

## 2022-02-23 DIAGNOSIS — I25119 Atherosclerotic heart disease of native coronary artery with unspecified angina pectoris: Secondary | ICD-10-CM | POA: Diagnosis not present

## 2022-02-23 DIAGNOSIS — Z299 Encounter for prophylactic measures, unspecified: Secondary | ICD-10-CM | POA: Diagnosis not present

## 2022-02-23 DIAGNOSIS — R42 Dizziness and giddiness: Secondary | ICD-10-CM | POA: Diagnosis not present

## 2022-02-23 DIAGNOSIS — M25561 Pain in right knee: Secondary | ICD-10-CM | POA: Diagnosis not present

## 2022-02-23 DIAGNOSIS — I10 Essential (primary) hypertension: Secondary | ICD-10-CM | POA: Diagnosis not present

## 2022-03-21 ENCOUNTER — Ambulatory Visit (INDEPENDENT_AMBULATORY_CARE_PROVIDER_SITE_OTHER): Payer: Medicare Other

## 2022-03-21 ENCOUNTER — Ambulatory Visit (INDEPENDENT_AMBULATORY_CARE_PROVIDER_SITE_OTHER): Payer: Medicare Other | Admitting: Orthopedic Surgery

## 2022-03-21 ENCOUNTER — Encounter: Payer: Self-pay | Admitting: Orthopedic Surgery

## 2022-03-21 VITALS — BP 122/80 | HR 88 | Ht 67.0 in | Wt 147.0 lb

## 2022-03-21 DIAGNOSIS — D509 Iron deficiency anemia, unspecified: Secondary | ICD-10-CM | POA: Insufficient documentation

## 2022-03-21 DIAGNOSIS — G8929 Other chronic pain: Secondary | ICD-10-CM | POA: Diagnosis not present

## 2022-03-21 DIAGNOSIS — M1711 Unilateral primary osteoarthritis, right knee: Secondary | ICD-10-CM

## 2022-03-21 DIAGNOSIS — M25561 Pain in right knee: Secondary | ICD-10-CM | POA: Diagnosis not present

## 2022-03-21 DIAGNOSIS — R634 Abnormal weight loss: Secondary | ICD-10-CM | POA: Insufficient documentation

## 2022-03-21 DIAGNOSIS — M21372 Foot drop, left foot: Secondary | ICD-10-CM | POA: Diagnosis not present

## 2022-03-21 DIAGNOSIS — M5136 Other intervertebral disc degeneration, lumbar region: Secondary | ICD-10-CM | POA: Diagnosis not present

## 2022-03-21 DIAGNOSIS — K5730 Diverticulosis of large intestine without perforation or abscess without bleeding: Secondary | ICD-10-CM | POA: Insufficient documentation

## 2022-03-21 DIAGNOSIS — K219 Gastro-esophageal reflux disease without esophagitis: Secondary | ICD-10-CM | POA: Insufficient documentation

## 2022-03-21 NOTE — Patient Instructions (Signed)
For MRI please go ahead and call to schedule your appointment with [facility] within at least one (1) week. Your insurance does not require any [employer] [phone]

## 2022-03-21 NOTE — Progress Notes (Unsigned)
Chief Complaint  Patient presents with   Knee Pain    right   Ankle Problem    Dragging left lower extremity for about a month can't lift foot    HPI: 71 year old male presents with a multitude of symptoms which are unclear.  He complains of right knee pain right calf leg and thigh pain and then for the last month he has had weakness in terms of dorsiflexing his left foot.  He complains of vague back pain he seen multiple doctors including chiropractors but to reset his right knee.  Not sure what that exactly entails but he says the doctor can put the right knee "back in place"  Past Medical History:  Diagnosis Date   Anemia    Angina pectoris (HCC)    Carotid bruit    Chest pain    Coronary artery disease    Diabetes mellitus without complication (HCC)    GERD (gastroesophageal reflux disease)    Hydrocele    Hyperlipidemia    Hypertension      Ht '5\' 7"'$  (1.702 m)   Wt 147 lb (66.7 kg)   BMI 23.02 kg/m    General appearance: Well-developed well-nourished no gross deformities  Cardiovascular normal pulse and perfusion normal color without edema  Neurologically:   Psychological: Awake alert and oriented x3 mood and affect normal  Skin no lacerations or ulcerations no nodularity no palpable masses, no erythema or nodularity  Musculoskeletal:  Right knee tenderness over the medial proximal tibia no effusion Range of motion Strength is normal No instability is felt  Leg lengths are equal  The left leg he definitely has a weak dorsiflexion in his left foot  Encounter Diagnoses  Name Primary?   Chronic pain of right knee    Acquired left foot drop    Primary osteoarthritis of right knee Yes   Foot drop, left    DDD (degenerative disc disease), lumbar      Imaging  X-ray right knee moderate arthritis  right knee X-ray L-spine spondylosis lumbar   A/P  Start tylenol arthrits for the knee pain   MRI lumbar spine for the foot drop

## 2022-04-10 ENCOUNTER — Ambulatory Visit: Payer: Medicare Other | Admitting: Cardiology

## 2022-04-10 ENCOUNTER — Encounter: Payer: Self-pay | Admitting: Cardiology

## 2022-04-10 VITALS — BP 133/84 | HR 81 | Resp 14 | Ht 67.0 in | Wt 146.6 lb

## 2022-04-10 DIAGNOSIS — Z87891 Personal history of nicotine dependence: Secondary | ICD-10-CM

## 2022-04-10 DIAGNOSIS — Z951 Presence of aortocoronary bypass graft: Secondary | ICD-10-CM | POA: Diagnosis not present

## 2022-04-10 DIAGNOSIS — E785 Hyperlipidemia, unspecified: Secondary | ICD-10-CM

## 2022-04-10 DIAGNOSIS — I10 Essential (primary) hypertension: Secondary | ICD-10-CM | POA: Diagnosis not present

## 2022-04-10 DIAGNOSIS — Z01810 Encounter for preprocedural cardiovascular examination: Secondary | ICD-10-CM | POA: Diagnosis not present

## 2022-04-10 DIAGNOSIS — I2510 Atherosclerotic heart disease of native coronary artery without angina pectoris: Secondary | ICD-10-CM | POA: Diagnosis not present

## 2022-04-10 DIAGNOSIS — E1159 Type 2 diabetes mellitus with other circulatory complications: Secondary | ICD-10-CM

## 2022-04-10 MED ORDER — ASPIRIN 81 MG PO TBEC: 81.0000 mg | DELAYED_RELEASE_TABLET | Freq: Every day | ORAL | 12 refills | Status: DC

## 2022-04-10 NOTE — Progress Notes (Signed)
Mark Combs Date of Birth: 31-Dec-1950 MRN: [id number] Primary Care Provider:Vyas, Costella Hatcher, MD  Date: 04/10/22 Last Office Visit: 05/24/2021  Chief Complaint  Patient presents with   Pre-op Exam    HPI  Mark Combs is a 71 y.o.  male whose past medical history and cardiovascular risk factors include: hypertension, non-insulin-dependent diabetes mellitus type 2, hyperlipidemia, former tobacco use, CAD s/p CABG x4 in [place], [place] in 2012.  Patient is being followed by the practice given his history of CAD with prior surgical revascularization in 2012 while he was in [place].  He presents today for preoperative risk stratification for upcoming hydrocele surgery.  He was originally scheduled sometime back but it was canceled as preoperative risk ratification from cardiology was requested.  He will have the surgery with Dr. Louis Meckel at [facility] urology.  He denies any heart failure symptoms.  He has some noncardiac discomfort that occurs once a week and, around the left pectoral muscle, intermittent, intensity 3/4 out of 10, usually relieved with 1 sublingual nitroglycerin tablets, more prominent during the morning hours.  The pain resolves when he starts moving.  The pain is not brought on by exertion and does not resolve with rest.  Overall intensity frequency and duration remains relatively stable since last office visit in February 2023  He continues to work in [employer].  He ambulates approximately 2 miles on level ground including stairs on a daily basis.  And his home blood pressures are well-controlled around 120-130 mmHg.    FUNCTIONAL STATUS: No structured exercise program or daily routine.  ALLERGIES: No Known Allergies  MEDICATION LIST PRIOR TO VISIT: Current Outpatient Medications on File Prior to Visit  Medication Sig Dispense Refill   atorvastatin (LIPITOR) 40 MG tablet Take 40 mg by mouth daily.     dapagliflozin propanediol (FARXIGA) 10 MG  TABS tablet Take 10 mg by mouth at bedtime.      finasteride (PROSCAR) 5 MG tablet Take 5 mg by mouth daily.     isosorbide mononitrate (IMDUR) 30 MG 24 hr tablet TAKE 1 TABLET (30 MG TOTAL) BY MOUTH IN THE MORNING. 90 tablet 3   metFORMIN (GLUCOPHAGE) 500 MG tablet Take 500 mg by mouth 2 (two) times daily.     metoprolol succinate (TOPROL-XL) 25 MG 24 hr tablet Take 25 mg by mouth 2 (two) times daily.      nitroGLYCERIN (NITROSTAT) 0.4 MG SL tablet Place 1 tablet (0.4 mg total) under the tongue every 5 (five) minutes as needed for chest pain. 25 tablet 3   olmesartan (BENICAR) 20 MG tablet TAKE 1 TABLET (20 MG TOTAL) BY MOUTH DAILY. 30 tablet 6   omeprazole (PRILOSEC) 40 MG capsule Take 40 mg by mouth at bedtime.     Semaglutide (RYBELSUS) 7 MG TABS Take by mouth.     tamsulosin (FLOMAX) 0.4 MG CAPS capsule Take 0.4 mg by mouth at bedtime.      No current facility-administered medications on file prior to visit.    PAST MEDICAL HISTORY: Past Medical History:  Diagnosis Date   Anemia    Angina pectoris (HCC)    Carotid bruit    Chest pain    Coronary artery disease    Diabetes mellitus without complication (HCC)    GERD (gastroesophageal reflux disease)    Hydrocele    Hyperlipidemia    Hypertension     PAST SURGICAL HISTORY: Past Surgical History:  Procedure Laterality Date   CARDIAC CATHETERIZATION  CORONARY ARTERY BYPASS GRAFT     HERNIA REPAIR      FAMILY HISTORY: The patient's family history includes Diabetes in his sister; Heart disease in his brother.   SOCIAL HISTORY:  The patient  reports that he has been smoking cigarettes. He has a 10.25 pack-year smoking history. He has never used smokeless tobacco. He reports current alcohol use. He reports that he does not use drugs.  Review of Systems  Cardiovascular:  Positive for chest pain. Negative for claudication, dyspnea on exertion, irregular heartbeat, leg swelling, near-syncope, orthopnea, palpitations,  paroxysmal nocturnal dyspnea and syncope.  Respiratory:  Negative for shortness of breath.   Hematologic/Lymphatic: Negative for bleeding problem.  Musculoskeletal:  Positive for arthritis. Negative for muscle cramps and myalgias.  Neurological:  Negative for dizziness and light-headedness.       Radicular pain in bilateral lower extremities, per patient    PHYSICAL EXAM:    04/10/2022   10:15 AM 03/21/2022    9:37 AM 05/24/2021   11:33 AM  Vitals with BMI  Height '5\' 7"'$  '5\' 7"'$  '5\' 6"'$   Weight 146 lbs 10 oz 147 lbs 141 lbs  BMI 22.96 17.00 17.49  Systolic 449 675 916  Diastolic 84 80 83  Pulse 81 88 90    Physical Exam  Constitutional: No distress.  Age appropriate, hemodynamically stable.   Neck: No JVD present.  Cardiovascular: Normal rate, regular rhythm, S1 normal, S2 normal, intact distal pulses and normal pulses. Exam reveals no gallop, no S3 and no S4.  No murmur heard. Pulmonary/Chest: Effort normal and breath sounds normal. No stridor. He has no wheezes. He has no rales.  Abdominal: Soft. Bowel sounds are normal. He exhibits no distension. There is no abdominal tenderness.  Musculoskeletal:        General: No edema.     Cervical back: Neck supple.  Neurological: He is alert and oriented to person, place, and time. He has intact cranial nerves (2-12).  Skin: Skin is warm and moist.   CARDIAC DATABASE: Coronary artery bypass grafting: Four-vessel bypass (2012 in [place])  EKG: 04/10/2022: Normal sinus rhythm, 78 bpm, without underlying ischemia or injury pattern.  Echocardiogram: 08/12/2019: Normal LV systolic function with visual EF 55-60%. Left ventricle cavity is normal in size. Mild left ventricular hypertrophy. Normal global wall motion. Normal diastolic filling pattern, normal LAP. Calculated EF 51%. Moderate pulmonic regurgitation. No other significant valvular abnormalities. No significant change compared to previous study dated 03/27/2018.     Stress Testing:  Lexiscan (Walking with mod Bruce)Tetrofosmin Stress Test 08/12/2019: Nondiagnostic ECG stress. Myocardial perfusion is normal. Overall LV systolic function is normal without regional wall motion abnormalities. Stress LV EF: 75%.  No previous exam available for comparison. Low risk.   Heart Catheterization: 05/04/2010: Normal LV systolic function, mid LAD 95% stenosis,  D1 tandem 75% stenosis, proximal circumflex 95% stenosis and extends into large OM1.  Mid RCA 75% stenosis.  Normal LVEF.  Carotid duplex: 03/27/2018: No hemodynamically significant arterial disease in the internal carotid artery bilaterally. There is minimal plaque bilateral carotids. Antegrade right vertebral artery flow. Antegrade left vertebral artery flow.  LABORATORY DATA:    Latest Ref Rng & Units 08/20/2020   11:58 PM  CBC  WBC 4.0 - 10.5 K/uL 15.3   Hemoglobin 13.0 - 17.0 g/dL 11.3   Hematocrit 39.0 - 52.0 % 36.1   Platelets 150 - 400 K/uL 235        Latest Ref Rng & Units 08/20/2020  11:58 PM  CMP  Glucose 70 - 99 mg/dL 208   BUN 8 - 23 mg/dL 16   Creatinine 0.61 - 1.24 mg/dL 1.09   Sodium 135 - 145 mmol/L 135   Potassium 3.5 - 5.1 mmol/L 4.2   Chloride 98 - 111 mmol/L 105   CO2 22 - 32 mmol/L 20   Calcium 8.9 - 10.3 mg/dL 9.3   Total Protein 6.5 - 8.1 g/dL 7.3   Total Bilirubin 0.3 - 1.2 mg/dL 0.9   Alkaline Phos 38 - 126 U/L 53   AST 15 - 41 U/L 17   ALT 0 - 44 U/L 13     Lipid Panel  No results found for: "CHOL", "TRIG", "HDL", "CHOLHDL", "VLDL", "LDLCALC", "LDLDIRECT", "LABVLDL"  No results found for: "HGBA1C" No components found for: "NTPROBNP" No results found for: "TSH"  Cardiac Panel (last 3 results) No results for input(s): "CKTOTAL", "CKMB", "TROPONINIHS", "RELINDX" in the last 72 hours.  FINAL MEDICATION LIST END OF ENCOUNTER: Meds ordered this encounter  Medications   aspirin EC 81 MG tablet    Sig: Take 1 tablet (81 mg total) by mouth daily. Swallow  whole.    Dispense:  30 tablet    Refill:  12     Current Outpatient Medications:    aspirin EC 81 MG tablet, Take 1 tablet (81 mg total) by mouth daily. Swallow whole., Disp: 30 tablet, Rfl: 12   atorvastatin (LIPITOR) 40 MG tablet, Take 40 mg by mouth daily., Disp: , Rfl:    dapagliflozin propanediol (FARXIGA) 10 MG TABS tablet, Take 10 mg by mouth at bedtime. , Disp: , Rfl:    finasteride (PROSCAR) 5 MG tablet, Take 5 mg by mouth daily., Disp: , Rfl:    isosorbide mononitrate (IMDUR) 30 MG 24 hr tablet, TAKE 1 TABLET (30 MG TOTAL) BY MOUTH IN THE MORNING., Disp: 90 tablet, Rfl: 3   metFORMIN (GLUCOPHAGE) 500 MG tablet, Take 500 mg by mouth 2 (two) times daily., Disp: , Rfl:    metoprolol succinate (TOPROL-XL) 25 MG 24 hr tablet, Take 25 mg by mouth 2 (two) times daily. , Disp: , Rfl:    nitroGLYCERIN (NITROSTAT) 0.4 MG SL tablet, Place 1 tablet (0.4 mg total) under the tongue every 5 (five) minutes as needed for chest pain., Disp: 25 tablet, Rfl: 3   olmesartan (BENICAR) 20 MG tablet, TAKE 1 TABLET (20 MG TOTAL) BY MOUTH DAILY., Disp: 30 tablet, Rfl: 6   omeprazole (PRILOSEC) 40 MG capsule, Take 40 mg by mouth at bedtime., Disp: , Rfl:    Semaglutide (RYBELSUS) 7 MG TABS, Take by mouth., Disp: , Rfl:    tamsulosin (FLOMAX) 0.4 MG CAPS capsule, Take 0.4 mg by mouth at bedtime. , Disp: , Rfl:   IMPRESSION:    ICD-10-CM   1. Preop cardiovascular exam  Z01.810 PCV ECHOCARDIOGRAM COMPLETE    PCV MYOCARDIAL PERFUSION WITH LEXISCAN    2. Atherosclerosis of native coronary artery of native heart without angina pectoris  I25.10 EKG 12-Lead    aspirin EC 81 MG tablet    PCV MYOCARDIAL PERFUSION WITH LEXISCAN    3. History of 4 vessel coronary artery bypass graft  Z95.1 aspirin EC 81 MG tablet    PCV MYOCARDIAL PERFUSION WITH LEXISCAN    4. Benign hypertension  I10     5. Hyperlipidemia LDL goal <70  E78.5     6. Type 2 diabetes mellitus with other circulatory complication, without  long-term current use of insulin (HCC)  E11.59 PCV MYOCARDIAL PERFUSION WITH LEXISCAN    7. Former tobacco use  Z87.891        RECOMMENDATIONS: Mark Combs is a 71 y.o. male whose past medical history and cardiovascular risk factors include: hypertension, non-insulin-dependent type 2 diabetes, hyperlipidemia, former tobacco use, CAD s/p CABG x4 in [place], [place] in 2012.  Preop cardiovascular exam EKG: Nonischemic. Chest pain predominantly noncardiac but given his history of surgical revascularization and the use of sublingual nitroglycerin tablets we will proceed with surface echocardiogram and pharmacological stress test for further risk stratification.   Recommended exercise MPI as he states he is able to walk 2 miles; however, he refuses exercise stress due to arthritis and radicular pain in bilateral lower extremities.  Scheduled to have MRI of the back in April 17, 2022. Prior studies from 2021 reviewed as part of today's office visit. Further recommendations to follow  Atherosclerosis of native coronary artery of native heart without angina pectoris / History of 4 vessel coronary artery bypass graft Start aspirin 81 mg p.o. daily after his surgery. Continue current medical therapy. A1c is very well-controlled based on the labs available in Care Everywhere. He also had recent labs with PCP which I will request copy for reference. Recommend a goal LDL ideally around 55 mg/dL but definitely <70 mg/dL. Ischemic workup as discussed above  Benign hypertension Home and office blood pressures are well-controlled. No changes warranted at this time  Hyperlipidemia LDL goal <70 Currently on atorvastatin.   He denies myalgia or other side effects. Currently managed by primary care provider.  Type 2 diabetes mellitus with other circulatory complication, without long-term current use of insulin (HCC) Hemoglobin A1c well controlled. Currently on Lipitor, Farxiga, metformin, ARB,  Rybelsus. Reemphasized importance of glycemic control   Orders Placed This Encounter  Procedures   PCV MYOCARDIAL PERFUSION WITH LEXISCAN   EKG 12-Lead   PCV ECHOCARDIOGRAM COMPLETE   --Continue cardiac medications as reconciled in final medication list. --Return in about 6 months (around 10/10/2022) for Follow up, CAD. Or sooner if needed. --Continue follow-up with your primary care physician regarding the management of your other chronic comorbid conditions.  Patient's questions and concerns were addressed to his satisfaction. He voices understanding of the instructions provided during this encounter.   This note was created using a voice recognition software as a result there may be grammatical errors inadvertently enclosed that do not reflect the nature of this encounter. Every attempt is made to correct such errors.  Rex Kras, [place], [facility]  Pager: [phone] Office: [phone]

## 2022-04-12 ENCOUNTER — Ambulatory Visit: Payer: Medicare Other

## 2022-04-12 DIAGNOSIS — E1159 Type 2 diabetes mellitus with other circulatory complications: Secondary | ICD-10-CM

## 2022-04-12 DIAGNOSIS — I2510 Atherosclerotic heart disease of native coronary artery without angina pectoris: Secondary | ICD-10-CM

## 2022-04-12 DIAGNOSIS — I10 Essential (primary) hypertension: Secondary | ICD-10-CM | POA: Diagnosis not present

## 2022-04-12 DIAGNOSIS — Z951 Presence of aortocoronary bypass graft: Secondary | ICD-10-CM | POA: Diagnosis not present

## 2022-04-12 DIAGNOSIS — Z01810 Encounter for preprocedural cardiovascular examination: Secondary | ICD-10-CM

## 2022-04-17 ENCOUNTER — Ambulatory Visit (HOSPITAL_COMMUNITY)
Admission: RE | Admit: 2022-04-17 | Discharge: 2022-04-17 | Disposition: A | Discharge status: Home / Self Care | Payer: Medicare Other | Source: Ambulatory Visit | Attending: Orthopedic Surgery | Admitting: Orthopedic Surgery

## 2022-04-17 DIAGNOSIS — M4716 Other spondylosis with myelopathy, lumbar region: Secondary | ICD-10-CM | POA: Diagnosis not present

## 2022-04-17 DIAGNOSIS — M21372 Foot drop, left foot: Secondary | ICD-10-CM | POA: Insufficient documentation

## 2022-04-17 DIAGNOSIS — M5126 Other intervertebral disc displacement, lumbar region: Secondary | ICD-10-CM | POA: Diagnosis not present

## 2022-04-17 DIAGNOSIS — M48061 Spinal stenosis, lumbar region without neurogenic claudication: Secondary | ICD-10-CM | POA: Diagnosis not present

## 2022-04-19 ENCOUNTER — Telehealth: Payer: Self-pay

## 2022-04-19 NOTE — Telephone Encounter (Signed)
Patient calling for results.

## 2022-04-20 ENCOUNTER — Encounter: Payer: Self-pay | Admitting: Orthopedic Surgery

## 2022-04-20 ENCOUNTER — Ambulatory Visit (INDEPENDENT_AMBULATORY_CARE_PROVIDER_SITE_OTHER): Payer: Medicare Other | Admitting: Orthopedic Surgery

## 2022-04-20 DIAGNOSIS — M5126 Other intervertebral disc displacement, lumbar region: Secondary | ICD-10-CM | POA: Diagnosis not present

## 2022-04-20 DIAGNOSIS — M5136 Other intervertebral disc degeneration, lumbar region: Secondary | ICD-10-CM | POA: Diagnosis not present

## 2022-04-20 DIAGNOSIS — M21372 Foot drop, left foot: Secondary | ICD-10-CM

## 2022-04-20 NOTE — Progress Notes (Signed)
FOLLOW UP   Encounter Diagnoses  Name Primary?   DDD (degenerative disc disease), lumbar Yes   Acquired left foot drop      Chief Complaint  Patient presents with   Back Pain    LBP//MRI Review   This is a 71 year old male who was sent for an MRI because of a foot drop he was also seen for osteoarthritis of his knee on the right  He has multiple medical problems was recently seen by cardiology for preop for hydrocele procedure  The MRI findings are listed below the most concerning is the central canal stenosis at L3 and 4 as well as the broad-based disc protrusion at L4 and 5  I reviewed the images and the findings with the patient and we will make the appropriate neurosurgical referral and he is ready he will let us know  Note I emphasized to him that the longer the pressures on the nerves the harder it is to get the muscle function back he still wants to talk it over with his family  EXAM: MRI LUMBAR SPINE WITHOUT CONTRAST   TECHNIQUE: Multiplanar, multisequence MR imaging of the lumbar spine was performed. No intravenous contrast was administered.   COMPARISON:  Lumbar radiographs 03/21/2022   FINDINGS: Segmentation:  Normal segmentation.  Lowest disc space L5-S1   Alignment: Mild retrolisthesis L2-3, L3-4 and L4-5. Mild dextroscoliosis.   Vertebrae: Negative for fracture or mass. Mild discogenic edema on the left at L3-4 with associated Schmorl's node.   Conus medullaris and cauda equina: Conus extends to the L1-2 level. Conus and cauda equina appear normal.   Paraspinal and other soft tissues: Negative for paraspinous mass or adenopathy   Disc levels:   L1-2: Mild disc bulging with small central disc protrusion. Negative for stenosis   L2-3: Disc degeneration with disc space narrowing and bilateral facet degeneration. Negative for stenosis   L3-4: Disc degeneration with disc bulging and endplate spurring. Mild facet degeneration. Moderate central canal  stenosis. Moderate subarticular stenosis bilaterally.   L4-5: Disc degeneration with diffuse endplate spurring. Broad-based central disc protrusion. Bilateral facet degeneration. Mild central canal stenosis and mild subarticular stenosis bilaterally   L5-S1: Small central disc protrusion. Negative for neural impingement or stenosis. Mild facet degeneration bilaterally.   IMPRESSION: 1. Multilevel degenerative change throughout the lumbar spine. 2. Moderate central canal stenosis L3-4 with moderate subarticular stenosis bilaterally. 3. Mild central canal stenosis L4-5 with mild subarticular stenosis bilaterally.     Electronically Signed   By: Franchot Gallo M.D.   On: 04/18/2022 16:20

## 2022-04-20 NOTE — Progress Notes (Signed)
Lvm for pt to call back. 

## 2022-04-24 ENCOUNTER — Telehealth: Payer: Self-pay

## 2022-04-24 ENCOUNTER — Encounter: Payer: Self-pay | Admitting: Cardiology

## 2022-04-24 NOTE — Telephone Encounter (Signed)
Spoke with patient about his surgical clearance. I let him know that per Dr. Terri Skains he is at acceptable risk and that his recent stress test was low risk and Echo noted preserved LVEF and no significant valvular heart disease. I also made him aware that he needed to hold his aspirin for 7 days before his procedure and could restart it at the discretion of his surgeon. He acknowledged understanding and accepted the risks.

## 2022-04-24 NOTE — Telephone Encounter (Signed)
Called patient to give them their surgical clearance information. Left a VM letting them know to give me a call back.

## 2022-04-24 NOTE — Telephone Encounter (Signed)
I did it electronically.   Dr. Terri Skains

## 2022-04-25 NOTE — Progress Notes (Signed)
Called patient to inform him about his stress test results. Pt understood due that someone called him to let him know.

## 2022-05-30 ENCOUNTER — Other Ambulatory Visit: Payer: Self-pay | Admitting: Cardiology

## 2022-05-30 DIAGNOSIS — I209 Angina pectoris, unspecified: Secondary | ICD-10-CM

## 2022-06-17 IMAGING — MR MR PROSTATE WO/W CM
12 series · 48 of 48 positions shown · IV contrast (13ml multihance)
Comparison: Prostate MRI 06/29/2017

CLINICAL DATA: Elevated PSA level of 14.4 on 06/20/2021. Prior
biopsy 07/25/2017 revealed Gleason 3+3=6 prostate adenocarcinoma and
10% of 1 core of the right mid gland, and Gleason 3+3=6 prostate
adenocarcinoma in 5% of 1 core of the left lateral apex. Atypical
glands in previous region of interest # 1 and # 2.

EXAM:
MR PROSTATE WITHOUT AND WITH CONTRAST
TECHNIQUE: Multiplanar multisequence MRI images were obtained of the pelvis
centered about the prostate. Pre and post contrast images were
obtained.
CONTRAST:  13mL MULTIHANCE GADOBENATE DIMEGLUMINE 529 MG/ML IV SOLN

[Series 3: T2 · coronal · 3.0mm · 0.56mm/px · 1 of 23 slices shown (1 of 3)]
[im 1/23]
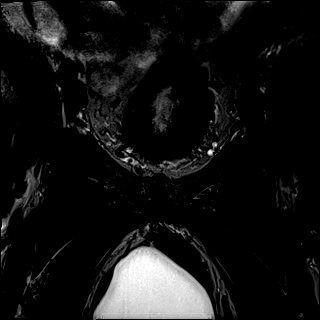

[Series 4: T1 · axial · 5.0mm · 1.25mm/px · 1 of 80 slices shown]
[im 1/80]
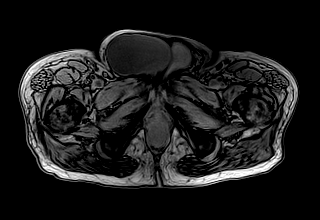

[Series 5: DWI · axial · 3.0mm · 1.75mm/px · z∈[+57,+126]mm · 2 of 72 slices shown (1 of 3)]
[im 1/72]
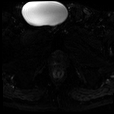
[im 72/72]
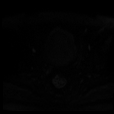

[Series 6: DWI · axial · 3.0mm · 1.75mm/px · 1 of 24 slices shown (2 of 3)]
[im 1/24]
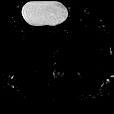

[Series 7: DWI · axial · 3.0mm · 1.75mm/px · 1 of 24 slices shown (3 of 3)]
[im 1/24]
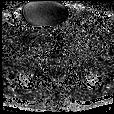

[Series 8: T2 · axial · 3.0mm · 0.56mm/px · 1 of 24 slices shown (2 of 3)]
[im 1/24]
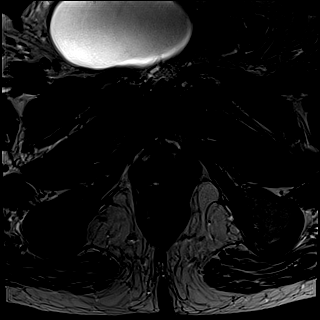

[Series 9: T2 · axial · 1.0mm · 1.04mm/px · z∈[+56,+127]mm · 2 of 72 slices shown (3 of 3)]
[im 1/72]
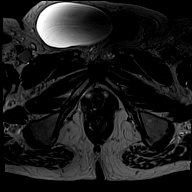
[im 72/72]
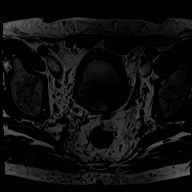

[Series 10: pre t1_twist_tra_dyn · axial · non-contrast · 3.5mm · 0.83mm/px · 1 of 20 slices shown]
[im 1/20]
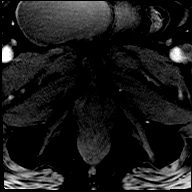

[Series 11: post t1_twist_tra_dyn-copy center · axial · non-contrast · 3.5mm · 0.83mm/px · z∈[+59,+125]mm · 17 of 600 slices shown]
[im 1/600]
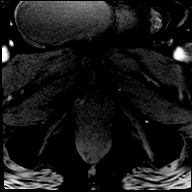
[im 38/600]
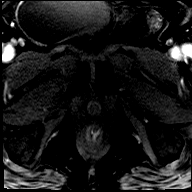
[im 75/600]
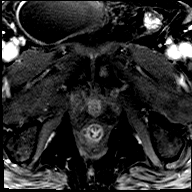
[im 113/600]
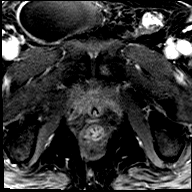
[im 150/600]
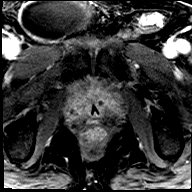
[im 188/600]
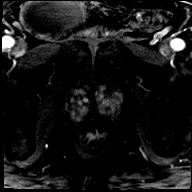
[im 225/600]
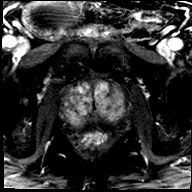
[im 263/600]
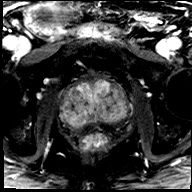
[im 300/600]
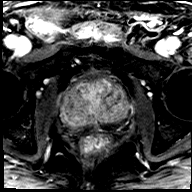
[im 337/600]
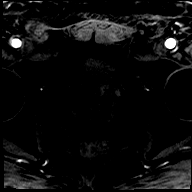
[im 375/600]
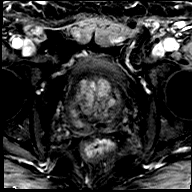
[im 412/600]
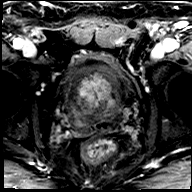
[im 450/600]
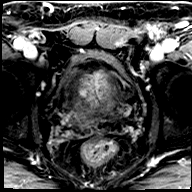
[im 487/600]
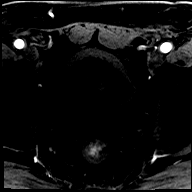
[im 525/600]
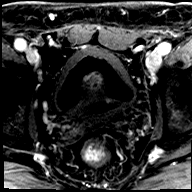
[im 562/600]
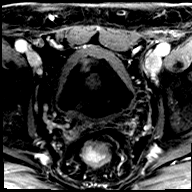
[im 600/600]
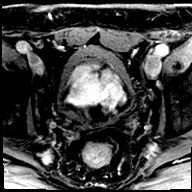

[Series 12: post t1_twist_tra_dyn-copy cent_sub · axial · 3.5mm · 0.83mm/px · z∈[+59,+125]mm · 17 of 580 slices shown]
[im 1/580]
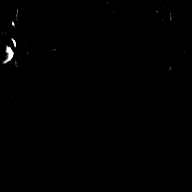
[im 37/580]
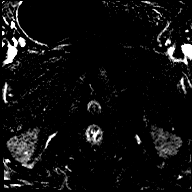
[im 73/580]
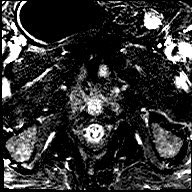
[im 109/580]
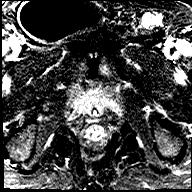
[im 145/580]
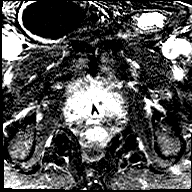
[im 181/580]
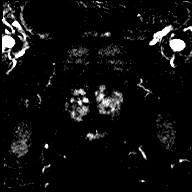
[im 218/580]
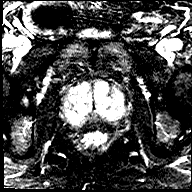
[im 254/580]
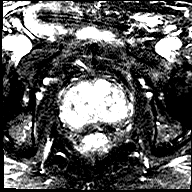
[im 290/580]
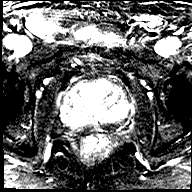
[im 326/580]
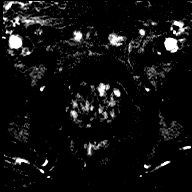
[im 362/580]
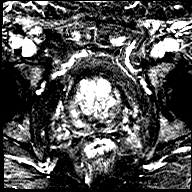
[im 399/580]
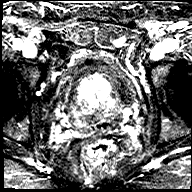
[im 435/580]
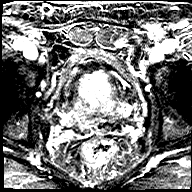
[im 471/580]
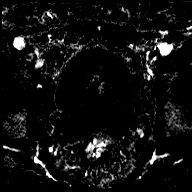
[im 507/580]
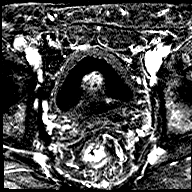
[im 543/580]
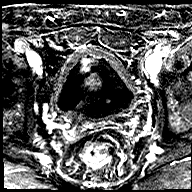
[im 580/580]
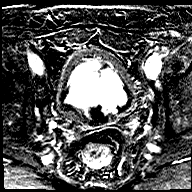

[Series 13: t1_vibe_dixon_tra_f · axial · 2.5mm · 0.91mm/px · z∈[+53,+251]mm · 2 of 80 slices shown]
[im 1/80]
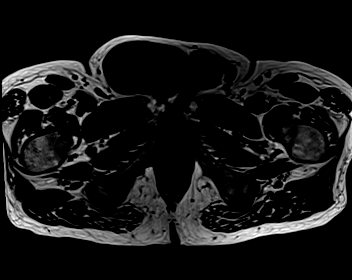
[im 80/80]
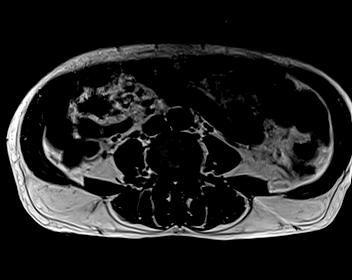

[Series 14: t1_vibe_dixon_tra_w · axial · 2.5mm · 0.91mm/px · z∈[+53,+251]mm · 2 of 80 slices shown]
[im 1/80]
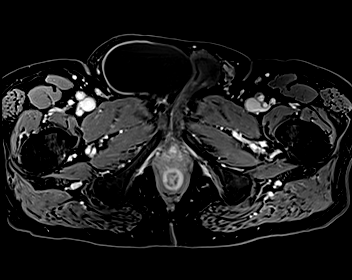
[im 80/80]
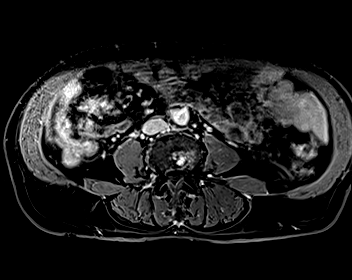

[48 of 48 positions shown; findings below may reference images not displayed]

FINDINGS: Prostate:

Encapsulated nodularity in the transition zone compatible with
benign prostatic hypertrophy. Thinning of the peripheral zone
particularly on the right side, with some mild low T2 signal
intensity stranding in the peripheral zone bilaterally which is
likely postinflammatory and considered PI-RADS category 2.

A very small PI-RADS category 4 lesion of the right posteromedial
peripheral zone at the apex these again identified with mild focally
reduced T2 signal (image 55, series 9) and focal early enhancement
(image 136, series 12). This measures only 0.09 cc (0.7 by 0.4 by
0.3 cm) and corresponds to the previous lesion noted in this
vicinity, which upon biopsy revealed only atypical glands.

No significant restricted diffusion signal on today's examination.
No definite lesion of intermediate or higher suspicion for prostate
cancer is identified.

The median lobe of the prostate gland indents the bladder base.

Volume: 3D volumetric analysis: Prostate volume 81.00 cc (6.1 by
by 6.1 cm).

Transcapsular spread:  Absent

Seminal vesicle involvement: Absent

Neurovascular bundle involvement: Absent

Pelvic adenopathy: Absent

Bone metastasis: Absent

Other findings: Large right fluid collection along the right groin
extending down towards the scrotum.
IMPRESSION: 1. Persistent very small PI-RADS category 4 lesion of the right
posteromedial peripheral zone at the apex. Previous targeted biopsy
revealed only atypical glands. No substantial enlargement or
increasing conspicuity.
2. Postinflammatory findings in the peripheral zone including some
thinning of the right peripheral zone and some faint low T2 signal
stranding.
3. No other lesion of intermediate or higher suspicion for prostate
cancer is identified.
4. Prostatomegaly and benign prostatic hypertrophy.
5. Note is again made of a large right fluid collection extending
along the right spermatic cord towards the scrotum.

## 2022-06-21 ENCOUNTER — Encounter: Payer: Self-pay | Admitting: Radiology

## 2022-07-11 DIAGNOSIS — Z299 Encounter for prophylactic measures, unspecified: Secondary | ICD-10-CM | POA: Diagnosis not present

## 2022-07-11 DIAGNOSIS — R197 Diarrhea, unspecified: Secondary | ICD-10-CM | POA: Diagnosis not present

## 2022-07-11 DIAGNOSIS — F17210 Nicotine dependence, cigarettes, uncomplicated: Secondary | ICD-10-CM | POA: Diagnosis not present

## 2022-07-11 DIAGNOSIS — I10 Essential (primary) hypertension: Secondary | ICD-10-CM | POA: Diagnosis not present

## 2022-07-11 DIAGNOSIS — K589 Irritable bowel syndrome without diarrhea: Secondary | ICD-10-CM | POA: Diagnosis not present

## 2022-08-30 ENCOUNTER — Other Ambulatory Visit: Payer: Self-pay | Admitting: Cardiology

## 2022-08-30 DIAGNOSIS — I209 Angina pectoris, unspecified: Secondary | ICD-10-CM

## 2022-08-30 MED ORDER — RYBELSUS 7 MG PO TABS: 1.0000 | ORAL_TABLET | Freq: Every day | ORAL | 1 refills | Status: DC

## 2022-10-11 ENCOUNTER — Ambulatory Visit: Payer: 59 | Admitting: Cardiology

## 2022-10-11 ENCOUNTER — Encounter: Payer: Self-pay | Admitting: Cardiology

## 2022-10-11 VITALS — BP 138/82 | HR 94 | Ht 67.0 in | Wt 148.0 lb

## 2022-10-11 DIAGNOSIS — Z951 Presence of aortocoronary bypass graft: Secondary | ICD-10-CM | POA: Diagnosis not present

## 2022-10-11 DIAGNOSIS — Z87891 Personal history of nicotine dependence: Secondary | ICD-10-CM | POA: Diagnosis not present

## 2022-10-11 DIAGNOSIS — E119 Type 2 diabetes mellitus without complications: Secondary | ICD-10-CM | POA: Diagnosis not present

## 2022-10-11 DIAGNOSIS — E1159 Type 2 diabetes mellitus with other circulatory complications: Secondary | ICD-10-CM

## 2022-10-11 DIAGNOSIS — E785 Hyperlipidemia, unspecified: Secondary | ICD-10-CM | POA: Diagnosis not present

## 2022-10-11 DIAGNOSIS — I10 Essential (primary) hypertension: Secondary | ICD-10-CM | POA: Diagnosis not present

## 2022-10-11 DIAGNOSIS — I2510 Atherosclerotic heart disease of native coronary artery without angina pectoris: Secondary | ICD-10-CM | POA: Diagnosis not present

## 2022-10-11 NOTE — Progress Notes (Signed)
[facility] Date of Birth: 01/31/1951 MRN: [id number] Primary Care Provider:Vyas, Angelina Pih, MD  Date: 10/11/22 Last Office Visit: 04/10/2022  Chief Complaint  Patient presents with   Atherosclerosis of native coronary artery of native heart w   Follow-up    HPI  [facility] is a 72 y.o.  male whose past medical history and cardiovascular risk factors include: hypertension, non-insulin-dependent diabetes mellitus type 2, hyperlipidemia, former tobacco use, CAD s/p CABG x4 in [place], [place] in 2012.  Patient is being followed by the practice given his history of CAD and four-vessel bypass back in 2012 [facility] [place]  He presents today for 79-month follow-up visit.  At last office visit patient was requesting preoperative clearance for urological surgery.  At that time he underwent echo and stress test results reviewed with him.  Over the last 6 months he denies anginal chest pain or heart failure symptoms.  No postprocedural complications from his prior urological procedure.  No recent labs available for review.  Overall functional capacity remains stable he approximately ambulates 2 miles on level ground on a regular basis given the fact that he is in [employer].   ALLERGIES: No Known Allergies  MEDICATION LIST PRIOR TO VISIT: Current Outpatient Medications on File Prior to Visit  Medication Sig Dispense Refill   atorvastatin (LIPITOR) 80 MG tablet Take 80 mg by mouth daily.     dapagliflozin propanediol (FARXIGA) 10 MG TABS tablet Take 10 mg by mouth at bedtime.      finasteride (PROSCAR) 5 MG tablet Take 5 mg by mouth daily.     isosorbide mononitrate (IMDUR) 30 MG 24 hr tablet Take 1 tablet (30 mg total) by mouth in the morning. 90 tablet 0   metFORMIN (GLUCOPHAGE) 500 MG tablet Take 500 mg by mouth 2 (two) times daily.     metoprolol succinate (TOPROL-XL) 25 MG 24 hr tablet Take 25 mg by mouth 2 (two) times daily.      nitroGLYCERIN (NITROSTAT) 0.4 MG SL  tablet Place 1 tablet (0.4 mg total) under the tongue every 5 (five) minutes as needed for chest pain. 25 tablet 3   olmesartan (BENICAR) 20 MG tablet TAKE 1 TABLET (20 MG TOTAL) BY MOUTH DAILY. 30 tablet 6   omeprazole (PRILOSEC) 40 MG capsule Take 40 mg by mouth at bedtime.     Semaglutide (RYBELSUS) 7 MG TABS Take 1 tablet (7 mg total) by mouth daily. 90 tablet 1   tamsulosin (FLOMAX) 0.4 MG CAPS capsule Take 0.4 mg by mouth at bedtime.      No current facility-administered medications on file prior to visit.    PAST MEDICAL HISTORY: Past Medical History:  Diagnosis Date   Anemia    Angina pectoris (HCC)    Carotid bruit    Chest pain    Coronary artery disease    Diabetes mellitus without complication (HCC)    GERD (gastroesophageal reflux disease)    Hydrocele    Hyperlipidemia    Hypertension     PAST SURGICAL HISTORY: Past Surgical History:  Procedure Laterality Date   CARDIAC CATHETERIZATION     CORONARY ARTERY BYPASS GRAFT     HERNIA REPAIR      FAMILY HISTORY: The patient's family history includes Diabetes in his sister; Heart disease in his brother.   SOCIAL HISTORY:  The patient  reports that he has been smoking cigarettes. He has a 10.25 pack-year smoking history. He has never used smokeless tobacco. He reports current alcohol use.  He reports that he does not use drugs.  Review of Systems  Cardiovascular:  Negative for chest pain, claudication, dyspnea on exertion, irregular heartbeat, leg swelling, near-syncope, orthopnea, palpitations, paroxysmal nocturnal dyspnea and syncope.  Respiratory:  Negative for shortness of breath.   Hematologic/Lymphatic: Negative for bleeding problem.  Musculoskeletal:  Negative for muscle cramps and myalgias.  Neurological:  Negative for dizziness and light-headedness.    PHYSICAL EXAM:    10/11/2022   10:04 AM 04/10/2022   10:15 AM 03/21/2022    9:37 AM  Vitals with BMI  Height 5\' 7"  5\' 7"  5\' 7"   Weight 148 lbs 146 lbs  10 oz 147 lbs  BMI 23.17 22.96 23.02  Systolic 138 133 161  Diastolic 82 84 80  Pulse 94 81 88    Physical Exam  Constitutional: No distress.  Age appropriate, hemodynamically stable.   Neck: No JVD present.  Cardiovascular: Normal rate, regular rhythm, S1 normal, S2 normal, intact distal pulses and normal pulses. Exam reveals no gallop, no S3 and no S4.  No murmur heard. Pulmonary/Chest: Effort normal and breath sounds normal. No stridor. He has no wheezes. He has no rales.  Abdominal: Soft. Bowel sounds are normal. He exhibits no distension. There is no abdominal tenderness.  Musculoskeletal:        General: No edema.     Cervical back: Neck supple.  Neurological: He is alert and oriented to person, place, and time. He has intact cranial nerves (2-12).  Skin: Skin is warm and moist.   CARDIAC DATABASE: Coronary artery bypass grafting: Four-vessel bypass (2012 in [place])  EKG: October 11, 2022: Sinus rhythm, 94 bpm, LAE, without underlying ischemia or injury pattern.  Echocardiogram: 04/12/2022: Normal LV systolic function with visual EF 60-65%. Left ventricle cavity is normal in size. Normal left ventricular wall thickness. Normal global wall motion. Normal diastolic filling pattern, normal LAP. Mild pulmonic regurgitation. Compared to 08/12/2019 moderate PR is now mild otherwise no significant change.    Stress Testing:  Lexiscan (Walking) Tetrofosmin stress test 04/12/2022: 1 Day Rest/Stress protocol.  Exercise time 4 minutes, achieved 2.29 METS, and 95% age predicated maximum heart rate.  Stress EKG positive for ischemia. Normal myocardial perfusion without convincing evidence of reversible ischemia or prior infarct. Left ventricular size normal, wall motion preserved, calculated LVEF 60%. No significant change compared to prior study dated 08/12/2019. Low risk study.   Heart Catheterization: 05/04/2010: Normal LV systolic function, mid LAD 95% stenosis,  D1  tandem 75% stenosis, proximal circumflex 95% stenosis and extends into large OM1.  Mid RCA 75% stenosis.  Normal LVEF.  Carotid duplex: 03/27/2018: No hemodynamically significant arterial disease in the internal carotid artery bilaterally. There is minimal plaque bilateral carotids. Antegrade right vertebral artery flow. Antegrade left vertebral artery flow.  LABORATORY DATA:    Latest Ref Rng & Units 08/20/2020   11:58 PM  CBC  WBC 4.0 - 10.5 K/uL 15.3   Hemoglobin 13.0 - 17.0 g/dL 09.6   Hematocrit 04.5 - 52.0 % 36.1   Platelets 150 - 400 K/uL 235        Latest Ref Rng & Units 08/20/2020   11:58 PM  CMP  Glucose 70 - 99 mg/dL 409   BUN 8 - 23 mg/dL 16   Creatinine 8.11 - 1.24 mg/dL 9.14   Sodium 782 - 956 mmol/L 135   Potassium 3.5 - 5.1 mmol/L 4.2   Chloride 98 - 111 mmol/L 105   CO2 22 - 32 mmol/L 20  Calcium 8.9 - 10.3 mg/dL 9.3   Total Protein 6.5 - 8.1 g/dL 7.3   Total Bilirubin 0.3 - 1.2 mg/dL 0.9   Alkaline Phos 38 - 126 U/L 53   AST 15 - 41 U/L 17   ALT 0 - 44 U/L 13     Lipid Panel  No results found for: "CHOL", "TRIG", "HDL", "CHOLHDL", "VLDL", "LDLCALC", "LDLDIRECT", "LABVLDL"  No results found for: "HGBA1C" No components found for: "NTPROBNP" No results found for: "TSH"  Cardiac Panel (last 3 results) No results for input(s): "CKTOTAL", "CKMB", "TROPONINIHS", "RELINDX" in the last 72 hours.  External Labs: Collected: August 09, 2021 Care Everywhere. A1c 6.3  Collected: Aug 26, 2020 Care Everywhere. Total cholesterol 214, triglycerides 236, HDL 40, LDL direct 134, non-HDL 174   FINAL MEDICATION LIST END OF ENCOUNTER: No orders of the defined types were placed in this encounter.    Current Outpatient Medications:    atorvastatin (LIPITOR) 80 MG tablet, Take 80 mg by mouth daily., Disp: , Rfl:    dapagliflozin propanediol (FARXIGA) 10 MG TABS tablet, Take 10 mg by mouth at bedtime. , Disp: , Rfl:    finasteride (PROSCAR) 5 MG tablet, Take 5 mg by  mouth daily., Disp: , Rfl:    isosorbide mononitrate (IMDUR) 30 MG 24 hr tablet, Take 1 tablet (30 mg total) by mouth in the morning., Disp: 90 tablet, Rfl: 0   metFORMIN (GLUCOPHAGE) 500 MG tablet, Take 500 mg by mouth 2 (two) times daily., Disp: , Rfl:    metoprolol succinate (TOPROL-XL) 25 MG 24 hr tablet, Take 25 mg by mouth 2 (two) times daily. , Disp: , Rfl:    nitroGLYCERIN (NITROSTAT) 0.4 MG SL tablet, Place 1 tablet (0.4 mg total) under the tongue every 5 (five) minutes as needed for chest pain., Disp: 25 tablet, Rfl: 3   olmesartan (BENICAR) 20 MG tablet, TAKE 1 TABLET (20 MG TOTAL) BY MOUTH DAILY., Disp: 30 tablet, Rfl: 6   omeprazole (PRILOSEC) 40 MG capsule, Take 40 mg by mouth at bedtime., Disp: , Rfl:    Semaglutide (RYBELSUS) 7 MG TABS, Take 1 tablet (7 mg total) by mouth daily., Disp: 90 tablet, Rfl: 1   tamsulosin (FLOMAX) 0.4 MG CAPS capsule, Take 0.4 mg by mouth at bedtime. , Disp: , Rfl:   IMPRESSION:    ICD-10-CM   1. Atherosclerosis of native coronary artery of native heart without angina pectoris  I25.10 EKG 12-Lead    Lipid Panel With LDL/HDL Ratio    LDL cholesterol, direct    CMP14+EGFR    2. History of 4 vessel coronary artery bypass graft  Z95.1 Lipid Panel With LDL/HDL Ratio    LDL cholesterol, direct    CMP14+EGFR    3. Benign hypertension  I10     4. Hyperlipidemia LDL goal <55  E78.5 Lipid Panel With LDL/HDL Ratio    LDL cholesterol, direct    CMP14+EGFR    5. Type 2 diabetes mellitus with other circulatory complication, without long-term current use of insulin (HCC)  E11.59     6. Former tobacco use  Z87.891        RECOMMENDATIONS: Johnavan Doeden is a 72 y.o. male whose past medical history and cardiovascular risk factors include: hypertension, non-insulin-dependent type 2 diabetes, hyperlipidemia, former tobacco use, CAD s/p CABG x4 in [place], [place] in 2012.  Atherosclerosis of native coronary artery of native heart without angina pectoris History  of 4 vessel coronary artery bypass graft Denies anginal chest pain. EKG  is nonischemic. Echo: Preserved LVEF, normal diastolic function, no significant valvular heart disease. Stress test December 2023: Low risk study.  See report for additional details. Reemphasized the importance of improving his modifiable cardiovascular risk factors. Hemoglobin A1c is well-controlled as noted above. Lipids have not been checked in the recent past and the numbers back in 2022 are not well-controlled. Reemphasized importance of reducing alcohol intake.  Benign hypertension Office blood pressures within acceptable limits. Medications reconciled. No changes warranted.  Hyperlipidemia LDL goal <55 Given his history of CAD status post surgical revascularization and diabetes would recommend a goal LDL of <55 mg/dL. Fasting lipid profile is ordered.  Type 2 diabetes mellitus with other circulatory complication, without long-term current use of insulin (HCC) Hemoglobin A1c is well-controlled. Currently on Lipitor, Farxiga, metformin, ARB, Rybelsus. Patient has tried Ozempic in the past but he had GI upset. He has been on Rybelsus for several years and claims to have lost 40 pounds.  Currently being prescribed by a different provider.  Discussed management of at least 2 chronic comorbid conditions, additional labs ordered as discussed above, EKG ordered and independently reviewed along with echo and stress test results.  Orders Placed This Encounter  Procedures   Lipid Panel With LDL/HDL Ratio   LDL cholesterol, direct   [id number]+AOZH   EKG 12-Lead   --Continue cardiac medications as reconciled in final medication list. --Return in about 6 months (around 04/12/2023) for Follow up, CAD. Or sooner if needed. --Continue follow-up with your primary care physician regarding the management of your other chronic comorbid conditions.  Patient's questions and concerns were addressed to his satisfaction. He voices  understanding of the instructions provided during this encounter.   This note was created using a voice recognition software as a result there may be grammatical errors inadvertently enclosed that do not reflect the nature of this encounter. Every attempt is made to correct such errors.  Tessa Lerner, [place], [facility]  Pager:  [phone] Office: [phone]

## 2022-11-14 DIAGNOSIS — F17210 Nicotine dependence, cigarettes, uncomplicated: Secondary | ICD-10-CM | POA: Diagnosis not present

## 2022-11-14 DIAGNOSIS — Z7189 Other specified counseling: Secondary | ICD-10-CM | POA: Diagnosis not present

## 2022-11-14 DIAGNOSIS — Z299 Encounter for prophylactic measures, unspecified: Secondary | ICD-10-CM | POA: Diagnosis not present

## 2022-11-14 DIAGNOSIS — I10 Essential (primary) hypertension: Secondary | ICD-10-CM | POA: Diagnosis not present

## 2022-11-14 DIAGNOSIS — Z0000 Encounter for general adult medical examination without abnormal findings: Secondary | ICD-10-CM | POA: Diagnosis not present

## 2022-11-14 DIAGNOSIS — I25119 Atherosclerotic heart disease of native coronary artery with unspecified angina pectoris: Secondary | ICD-10-CM | POA: Diagnosis not present

## 2022-12-17 ENCOUNTER — Other Ambulatory Visit: Payer: Self-pay | Admitting: Cardiology

## 2023-01-09 ENCOUNTER — Other Ambulatory Visit: Payer: Self-pay

## 2023-01-09 ENCOUNTER — Telehealth: Payer: Self-pay | Admitting: Cardiology

## 2023-01-09 ENCOUNTER — Encounter (HOSPITAL_COMMUNITY): Payer: Self-pay | Admitting: *Deleted

## 2023-01-09 ENCOUNTER — Emergency Department (HOSPITAL_COMMUNITY)
Admission: EM | Admit: 2023-01-09 | Discharge: 2023-01-09 | Disposition: A | Discharge status: Home / Self Care | Payer: 59 | Attending: Emergency Medicine | Admitting: Emergency Medicine

## 2023-01-09 ENCOUNTER — Emergency Department (HOSPITAL_COMMUNITY): Payer: 59

## 2023-01-09 DIAGNOSIS — I2510 Atherosclerotic heart disease of native coronary artery without angina pectoris: Secondary | ICD-10-CM | POA: Insufficient documentation

## 2023-01-09 DIAGNOSIS — Z79899 Other long term (current) drug therapy: Secondary | ICD-10-CM | POA: Diagnosis not present

## 2023-01-09 DIAGNOSIS — K769 Liver disease, unspecified: Secondary | ICD-10-CM | POA: Diagnosis not present

## 2023-01-09 DIAGNOSIS — Z955 Presence of coronary angioplasty implant and graft: Secondary | ICD-10-CM | POA: Insufficient documentation

## 2023-01-09 DIAGNOSIS — Z7984 Long term (current) use of oral hypoglycemic drugs: Secondary | ICD-10-CM | POA: Insufficient documentation

## 2023-01-09 DIAGNOSIS — E119 Type 2 diabetes mellitus without complications: Secondary | ICD-10-CM | POA: Diagnosis not present

## 2023-01-09 DIAGNOSIS — I10 Essential (primary) hypertension: Secondary | ICD-10-CM | POA: Insufficient documentation

## 2023-01-09 DIAGNOSIS — R079 Chest pain, unspecified: Secondary | ICD-10-CM | POA: Diagnosis not present

## 2023-01-09 DIAGNOSIS — R072 Precordial pain: Secondary | ICD-10-CM | POA: Insufficient documentation

## 2023-01-09 LAB — BASIC METABOLIC PANEL WITH GFR
Anion gap: 8 (ref 5–15)
BUN: 14 mg/dL (ref 8–23)
CO2: 24 mmol/L (ref 22–32)
Calcium: 9 mg/dL (ref 8.9–10.3)
Chloride: 106 mmol/L (ref 98–111)
Creatinine, Ser: 0.74 mg/dL (ref 0.61–1.24)
GFR, Estimated: >60 mL/min (ref >60)
Glucose, Bld: 144 mg/dL — ABNORMAL HIGH (ref 70–99)
Potassium: 4.4 mmol/L (ref 3.5–5.1)
Sodium: 138 mmol/L (ref 135–145)

## 2023-01-09 LAB — TROPONIN I (HIGH SENSITIVITY)
Troponin I (High Sensitivity): 2 ng/L (ref <18)
Troponin I (High Sensitivity): <2 ng/L (ref <18)

## 2023-01-09 LAB — CBC
HCT: 33.8 % — ABNORMAL LOW (ref 39.0–52.0)
Hemoglobin: 10.3 g/dL — ABNORMAL LOW (ref 13.0–17.0)
MCH: 21.7 pg — ABNORMAL LOW (ref 26.0–34.0)
MCHC: 30.5 g/dL (ref 30.0–36.0)
MCV: 71.3 fL — ABNORMAL LOW (ref 80.0–100.0)
Platelets: 169 10*3/uL (ref 150–400)
RBC: 4.74 MIL/uL (ref 4.22–5.81)
RDW: 19.6 % — ABNORMAL HIGH (ref 11.5–15.5)
WBC: 5.6 10*3/uL (ref 4.0–10.5)
nRBC: 0 % (ref 0.0–0.2)

## 2023-01-09 MED ORDER — RANOLAZINE ER 500 MG PO TB12: 500.0000 mg | ORAL_TABLET | Freq: Two times a day (BID) | ORAL | 0 refills | Status: DC

## 2023-01-09 NOTE — Telephone Encounter (Signed)
Patient presented to [facility], cardiac workup negative for ACS and no new EKG abnormality.  Discussed with Dr. Rhunette Croft, patient can be discharged home with outpatient follow-up, he has an appointment in December 2024 however will prepontine over the next 3 to 4 weeks, will discharge him on Ranexa 500 mg p.o. twice daily.

## 2023-01-09 NOTE — ED Triage Notes (Signed)
Pt with left sided CP x 2 days, took 2 SL NTG this morning-no relief.  + belching.

## 2023-01-09 NOTE — ED Provider Notes (Signed)
[place] EMERGENCY DEPARTMENT AT [facility] Provider Note   CSN: [id number] Arrival date & time: 01/09/23  1215     History  Chief Complaint  Patient presents with   Chest Pain    Mark Combs is a 72 y.o. male.  HPI     72 year old male comes in with chief complaint of chest pain.  Patient has been having intermittent episodes of left-sided chest pain described as dull pain, typically occurring in the morning and will last just for few minutes.  Pain is nonradiating and unprovoked with no specific aggravating or relieving factors.  Patient today did take nitro, he feels like it did give him some relief.  In general, patient has not had recurrent chest pain and he denies any exertional chest pain.  He had gone hiking last week and did not have any issues.  He denies any associated shortness of breath, nausea, sweating.  Patient denies any smoking.  He does admit to occasionally missing his medications.  Given that the chest pain had some features consistent with his previous ACS when he required CABG in 2012, he decided to come to the emergency room.  Home Medications Prior to Admission medications   Medication Sig Start Date End Date Taking? Authorizing Provider  atorvastatin (LIPITOR) 80 MG tablet Take 80 mg by mouth daily. 09/19/22   [provider]  dapagliflozin propanediol (FARXIGA) 10 MG TABS tablet Take 10 mg by mouth at bedtime.     [provider]  finasteride (PROSCAR) 5 MG tablet Take 5 mg by mouth daily. 08/16/20   [provider]  isosorbide mononitrate (IMDUR) 30 MG 24 hr tablet Take 1 tablet (30 mg total) by mouth in the morning. 08/30/22 11/28/22  Tolia, Sunit, DO  metFORMIN (GLUCOPHAGE) 500 MG tablet Take 500 mg by mouth 2 (two) times daily. 05/13/19   [provider]  metoprolol succinate (TOPROL-XL) 25 MG 24 hr tablet Take 25 mg by mouth 2 (two) times daily.     [provider]  nitroGLYCERIN (NITROSTAT) 0.4 MG SL  tablet Place 1 tablet (0.4 mg total) under the tongue every 5 (five) minutes as needed for chest pain. 12/05/20   Tolia, Sunit, DO  olmesartan (BENICAR) 20 MG tablet TAKE 1 TABLET (20 MG TOTAL) BY MOUTH DAILY. 02/15/20   Tolia, Sunit, DO  omeprazole (PRILOSEC) 40 MG capsule Take 40 mg by mouth at bedtime.    [provider]  RYBELSUS 7 MG TABS TAKE 1 TABLET (7 MG TOTAL) BY MOUTH DAILY. 12/17/22   Tolia, Sunit, DO  tamsulosin (FLOMAX) 0.4 MG CAPS capsule Take 0.4 mg by mouth at bedtime.     [provider]      Allergies    Patient has no known allergies.    Review of Systems   Review of Systems  All other systems reviewed and are negative.   Physical Exam Updated Vital Signs BP (!) 153/95 (BP Location: Right Arm)   Pulse 91   Temp 97.7 F (36.5 C) (Oral)   Resp 18   Ht 5\' 6"  (1.676 m)   Wt 64.4 kg   SpO2 100%   BMI 22.92 kg/m  Physical Exam Vitals and nursing note reviewed.  Constitutional:      Appearance: He is well-developed.  HENT:     Head: Atraumatic.  Cardiovascular:     Rate and Rhythm: Normal rate.     Heart sounds: Normal heart sounds.  Pulmonary:     Effort: Pulmonary  effort is normal.  Musculoskeletal:     Cervical back: Neck supple.     Right lower leg: No edema.     Left lower leg: No edema.  Skin:    General: Skin is warm.  Neurological:     Mental Status: He is alert and oriented to person, place, and time.     ED Results / Procedures / Treatments   Labs (all labs ordered are listed, but only abnormal results are displayed) Labs Reviewed  BASIC METABOLIC PANEL - Abnormal; Notable for the following components:      Result Value   Glucose, Bld 144 (*)    All other components within normal limits  CBC - Abnormal; Notable for the following components:   Hemoglobin 10.3 (*)    HCT 33.8 (*)    MCV 71.3 (*)    MCH 21.7 (*)    RDW 19.6 (*)    All other components within normal limits  TROPONIN I (HIGH SENSITIVITY)  TROPONIN I  (HIGH SENSITIVITY)    EKG EKG Interpretation Date/Time:  Wednesday January 09 2023 12:23:48 EDT Ventricular Rate:  91 PR Interval:  148 QRS Duration:  76 QT Interval:  330 QTC Calculation: 405 R Axis:   42  Text Interpretation: Normal sinus rhythm Possible Anterior infarct , age undetermined Abnormal ECG No previous ECGs available No acute changes No old tracing to compare Confirmed by Derwood Kaplan ([id number]) on 01/09/2023 3:24:46 PM  Radiology DG Chest 2 View  Result Date: 01/09/2023 CLINICAL DATA:  72 year old male with chest pain onset 2 days ago. No improvement with nitroglycerin this morning. EXAM: CHEST - 2 VIEW COMPARISON:  Chest radiograph 10/10/2020 and earlier. FINDINGS: PA and lateral views at 1235 hours. Chronic CABG. Other mediastinal contours are within normal limits. Visualized tracheal air column is within normal limits. The lungs appear stable and clear. No pneumothorax or pleural effusion. 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 Negative visible bowel gas. No acute osseous abnormality identified. IMPRESSION: 1. No acute cardiopulmonary abnormality. 2. Chronic CABG, rim calcified chronic and benign left hepatic lesion. Electronically Signed   By: Odessa Fleming M.D.   On: 01/09/2023 12:52    Procedures Procedures    Medications Ordered in ED Medications - No data to display  ED Course/ Medical Decision Making/ A&P                                 Medical Decision Making Amount and/or Complexity of Data Reviewed Labs: ordered. Radiology: ordered.  Risk Prescription drug management.   This patient presents to the ED with chief complaint(s) of chest pain with pertinent past medical history of CAD status post CABG in 2012, hypertension, hyperlipidemia, diabetes.The complaint involves an extensive differential diagnosis and also carries with  it a high risk of complications and morbidity.    The differential diagnosis considered for this patient includes  ACS syndrome Aortic dissection CHF exacerbation Valvular disorder Myocarditis Pericarditis Pneumothorax Musculoskeletal pain PUD / Gastritis / Esophagitis Esophageal spasm  The initial plan is to get basic labs including delta troponin. Initial EKG is reassuring.  Unfortunate, no old EKG to compare.   Additional history obtained: Records reviewed  cardiology records have been reviewed.  Patient has had couple of echocardiograms in the last 4 years which were reassuring.  Independent labs  interpretation:  The following labs were independently interpreted: Delta troponin that are flat and undetectable.  Mild anemia noted.  Independent visualization and interpretation of imaging: - I independently visualized the following imaging with scope of interpretation limited to determining acute life threatening conditions related to emergency care: X-ray of the chest, which revealed no evidence of pleural effusion, pneumothorax  Treatment and Reassessment: Patient reassessed.  Remains chest pain-free.  I consulted Dr. Jacinto Halim, cardiology who sees the patient.  He recommends that we start Ranexa 500 mg twice daily for the next month and they will contact patient for a prompt follow-up.  The patient appears reasonably screened and/or stabilized for discharge and I doubt any other medical condition or other [facility] requiring further screening, evaluation, or treatment in the ED at this time prior to discharge.   Results from the ER workup discussed with the patient face to face and all questions answered to the best of my ability. The patient is safe for discharge with strict return precautions.    Final Clinical Impression(s) / ED Diagnoses Final diagnoses:  Precordial chest pain    Rx / DC Orders ED Discharge Orders     None         Derwood Kaplan, MD 01/09/23 1630

## 2023-01-09 NOTE — Discharge Instructions (Signed)
We saw you in the ER for the chest pain. All of our cardiac workup is normal, including labs, EKG and chest X-RAY are normal. We are not sure what is causing your discomfort, but we feel comfortable sending you home at this time.   We discussed the case with your cardiology service.  They have requested that we start you on Ranexa, which will likely alleviate the chest pain altogether.  They will also call you and set up an appointment to be seen sooner.  Given that you have known history of previous coronary artery disease, please return to the ER if you have worsening chest pain, shortness of breath, pain radiating to your jaw, shoulder, or back, sweats or fainting. Otherwise see the Cardiologist or your primary care doctor as requested.

## 2023-01-31 DIAGNOSIS — I10 Essential (primary) hypertension: Secondary | ICD-10-CM | POA: Diagnosis not present

## 2023-01-31 DIAGNOSIS — M25561 Pain in right knee: Secondary | ICD-10-CM | POA: Diagnosis not present

## 2023-01-31 DIAGNOSIS — Z299 Encounter for prophylactic measures, unspecified: Secondary | ICD-10-CM | POA: Diagnosis not present

## 2023-01-31 DIAGNOSIS — M1711 Unilateral primary osteoarthritis, right knee: Secondary | ICD-10-CM | POA: Diagnosis not present

## 2023-02-06 ENCOUNTER — Ambulatory Visit: Payer: 59 | Admitting: Cardiology

## 2023-02-20 ENCOUNTER — Telehealth: Payer: Self-pay | Admitting: Cardiology

## 2023-02-20 ENCOUNTER — Ambulatory Visit: Payer: 59 | Attending: Cardiology | Admitting: Cardiology

## 2023-02-20 NOTE — Telephone Encounter (Signed)
Sure. Thank you for the update.   Nasier Thumm [place], DO, [facility]

## 2023-02-20 NOTE — Telephone Encounter (Signed)
Pt would like to switch from Dr. Odis Hollingshead to Dr. Dina Rich. Please advise

## 2023-04-15 ENCOUNTER — Ambulatory Visit: Payer: 59 | Admitting: Cardiology

## 2023-05-13 ENCOUNTER — Ambulatory Visit: Payer: 59 | Admitting: Cardiology

## 2023-05-15 DIAGNOSIS — F17210 Nicotine dependence, cigarettes, uncomplicated: Secondary | ICD-10-CM | POA: Diagnosis not present

## 2023-05-15 DIAGNOSIS — Z299 Encounter for prophylactic measures, unspecified: Secondary | ICD-10-CM | POA: Diagnosis not present

## 2023-05-15 DIAGNOSIS — Z7189 Other specified counseling: Secondary | ICD-10-CM | POA: Diagnosis not present

## 2023-05-15 DIAGNOSIS — Z0000 Encounter for general adult medical examination without abnormal findings: Secondary | ICD-10-CM | POA: Diagnosis not present

## 2023-05-15 DIAGNOSIS — I10 Essential (primary) hypertension: Secondary | ICD-10-CM | POA: Diagnosis not present

## 2023-05-15 DIAGNOSIS — I25119 Atherosclerotic heart disease of native coronary artery with unspecified angina pectoris: Secondary | ICD-10-CM | POA: Diagnosis not present

## 2023-05-29 ENCOUNTER — Ambulatory Visit: Payer: Medicare Other | Attending: Cardiology | Admitting: Cardiology

## 2023-05-29 ENCOUNTER — Encounter: Payer: Self-pay | Admitting: Cardiology

## 2023-05-29 VITALS — BP 134/82 | HR 85 | Ht 66.0 in | Wt 142.8 lb

## 2023-05-29 DIAGNOSIS — I2510 Atherosclerotic heart disease of native coronary artery without angina pectoris: Secondary | ICD-10-CM | POA: Diagnosis not present

## 2023-05-29 DIAGNOSIS — I10 Essential (primary) hypertension: Secondary | ICD-10-CM | POA: Diagnosis not present

## 2023-05-29 DIAGNOSIS — E782 Mixed hyperlipidemia: Secondary | ICD-10-CM | POA: Diagnosis not present

## 2023-05-29 MED ORDER — ISOSORBIDE MONONITRATE ER 30 MG PO TB24: 15.0000 mg | ORAL_TABLET | Freq: Every morning | ORAL | 3 refills | Status: DC

## 2023-05-29 NOTE — Patient Instructions (Signed)
 Medication Instructions:  Your physician has recommended you make the following change in your medication:   -Decrease Imdur  to 15 mg once daily.   *If you need a refill on your cardiac medications before your next appointment, please call your pharmacy*   Lab Work: None If you have labs (blood work) drawn today and your tests are completely normal, you will receive your results only by: MyChart Message (if you have MyChart) OR A paper copy in the mail If you have any lab test that is abnormal or we need to change your treatment, we will call you to review the results.   Testing/Procedures: None   Follow-Up: At [facility], you and your health needs are our priority.  As part of our continuing mission to provide you with exceptional heart care, we have created designated Provider Care Teams.  These Care Teams include your primary Cardiologist (physician) and Advanced Practice Providers (APPs -  Physician Assistants and Nurse Practitioners) who all work together to provide you with the care you need, when you need it.  We recommend signing up for the patient portal called MyChart.  Sign up information is provided on this After Visit Summary.  MyChart is used to connect with patients for Virtual Visits (Telemedicine).  Patients are able to view lab/test results, encounter notes, upcoming appointments, etc.  Non-urgent messages can be sent to your provider as well.   To learn more about what you can do with MyChart, go to [email or web address].    Your next appointment:   6 month(s)  Provider:   You may see Dorn Ross, MD or one of the following Advanced Practice Providers on your designated Care Team:   Laymon Qua, PA-C  Olivia Pavy, [place]     Other Instructions

## 2023-05-29 NOTE — Progress Notes (Signed)
 Clinical Summary Mark Combs is a 73 y.o.male seen today for follow up of the following medical problems.     1.CAD - history of prior CABG in [place] 2012, 4 vessel - 03/2022 nuclear stress: exercised 4 min, abnormal EKG but normal perfusion imaging.  - 03/2022 echo: LVEF 60-65%, no WMAs  - left sided, dull discomfort. 3/10 in severity. No other associated symptoms. Not positiona. No relation to food. Lasts 5-10 minutes. Infrequent, can occur at rest or with activity. Chronic symptoms.  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  - compliant with meds - dizziness at times, he tries spreading out his imdur  and toprol  which helps at times.   2. HTN - compliant with meds - home bp's 130s/80s  3. HLD - upcoming labs with pcp   4. DM2 - per pcp     Past Medical History:  Diagnosis Date   Anemia    Angina pectoris (HCC)    Carotid bruit    Chest pain    Coronary artery disease    Diabetes mellitus without complication (HCC)    GERD (gastroesophageal reflux disease)    Hydrocele    Hyperlipidemia    Hypertension      No Known Allergies   Current Outpatient Medications  Medication Sig Dispense Refill   atorvastatin (LIPITOR) 80 MG tablet Take 80 mg by mouth daily.     dapagliflozin propanediol (FARXIGA) 10 MG TABS tablet Take 10 mg by mouth at bedtime.      finasteride (PROSCAR) 5 MG tablet Take 5 mg by mouth daily.     isosorbide  mononitrate (IMDUR ) 30 MG 24 hr tablet Take 1 tablet (30 mg total) by mouth in the morning. 90 tablet 0   metFORMIN (GLUCOPHAGE) 500 MG tablet Take 500 mg by mouth 2 (two) times daily.     metoprolol  succinate (TOPROL -XL) 25 MG 24 hr tablet Take 25 mg by mouth 2 (two) times daily.      nitroGLYCERIN  (NITROSTAT ) 0.4 MG SL tablet Place 1 tablet (0.4 mg total) under the tongue every 5 (five) minutes as needed for chest pain. 25 tablet 3   olmesartan  (BENICAR ) 20 MG tablet TAKE 1 TABLET (20 MG TOTAL) BY MOUTH  DAILY. 30 tablet 6   omeprazole (PRILOSEC) 40 MG capsule Take 40 mg by mouth at bedtime.     ranolazine  (RANEXA ) 500 MG 12 hr tablet Take 1 tablet (500 mg total) by mouth 2 (two) times daily. 60 tablet 0   RYBELSUS  7 MG TABS TAKE 1 TABLET (7 MG TOTAL) BY MOUTH DAILY. 90 tablet 1   tamsulosin (FLOMAX) 0.4 MG CAPS capsule Take 0.4 mg by mouth at bedtime.      No current facility-administered medications for this visit.     Past Surgical History:  Procedure Laterality Date   CARDIAC CATHETERIZATION     CORONARY ARTERY BYPASS GRAFT     HERNIA REPAIR       No Known Allergies    Family History  Problem Relation Age of Onset   Diabetes Sister    Heart disease Brother      Social History Mr. Markovitz reports that he has been smoking cigarettes. He has a 10.3 pack-year smoking history. He has never used smokeless tobacco. Mr. Rieth reports current alcohol use.      Physical Examination Today's Vitals   05/29/23 1521  BP: 134/82  Pulse: 85  SpO2: 92%  Weight: 142 lb 12.8  oz (64.8 kg)  Height: 5' 6 (1.676 m)   Body mass index is 23.05 kg/m.  Gen: resting comfortably, no acute distress HEENT: no scleral icterus, pupils equal round and reactive, no palptable cervical adenopathy,  CV: RRR, no m/rg, no jvd Resp: Clear to auscultation bilaterally GI: abdomen is soft, non-tender, non-distended, normal bowel sounds, no hepatosplenomegaly MSK: extremities are warm, no edema.  Skin: warm, no rash Neuro:  no focal deficits Psych: appropriate affect     Assessment and Plan  1.CAD - chronic atypical chest pains, recent stress testing was benign - due to orthostatic dizziness lower imdur  to 15mg  daily, continue other meds  2. HLD - f/u upcoming labs with pcp  3. HTN - at goal, continue current meds      Dorn PHEBE Ross, M.D.

## 2023-06-26 DIAGNOSIS — Z299 Encounter for prophylactic measures, unspecified: Secondary | ICD-10-CM | POA: Diagnosis not present

## 2023-06-26 DIAGNOSIS — E1165 Type 2 diabetes mellitus with hyperglycemia: Secondary | ICD-10-CM | POA: Diagnosis not present

## 2023-06-26 DIAGNOSIS — I10 Essential (primary) hypertension: Secondary | ICD-10-CM | POA: Diagnosis not present

## 2023-06-26 DIAGNOSIS — I25119 Atherosclerotic heart disease of native coronary artery with unspecified angina pectoris: Secondary | ICD-10-CM | POA: Diagnosis not present

## 2023-07-23 ENCOUNTER — Other Ambulatory Visit: Payer: Self-pay | Admitting: Cardiology

## 2023-07-25 ENCOUNTER — Telehealth: Payer: Self-pay | Admitting: Pharmacy Technician

## 2023-07-25 NOTE — Telephone Encounter (Signed)
 Pharmacy Patient Advocate Encounter   Received notification from CoverMyMeds that prior authorization for rybelus is required/requested.   Insurance verification completed.   The patient is insured through [employer] .   Per test claim: PA required; PA submitted to above mentioned insurance via CoverMyMeds Key/confirmation #/EOC [id number] Status is pending

## 2023-07-26 NOTE — Telephone Encounter (Signed)
 Pharmacy Patient Advocate Encounter  Received notification from [facility] that Prior Authorization for rybelus has been APPROVED from 07/25/23 to 04/22/24. Spoke to pharmacy to process.Copay is $235.74 for 30 days .    PA #/Case ID/Reference #: [id number]

## 2023-08-21 ENCOUNTER — Telehealth: Payer: Self-pay | Admitting: Cardiology

## 2023-08-21 MED ORDER — RANOLAZINE ER 500 MG PO TB12: 500.0000 mg | ORAL_TABLET | Freq: Two times a day (BID) | ORAL | 6 refills | Status: DC

## 2023-08-21 NOTE — Telephone Encounter (Signed)
 Pt c/o of Chest Pain: STAT if active CP, including tightness, pressure, jaw pain, radiating pain to shoulder/upper arm/back, CP unrelieved by Nitro. Symptoms reported of SOB, nausea, vomiting, sweating.  1. Are you having CP right now? No     2. Are you experiencing any other symptoms (ex. SOB, nausea, vomiting, sweating)?    3. Is your CP continuous or coming and going? coming and going   4. Have you taken Nitroglycerin ? No    5. How long have you been experiencing CP? 4 days ago    6. If NO CP at time of call then end call with telling Pt to call back or call 911 if Chest pain returns prior to return call from triage team.   pt said, he's been experiencing on and off dull pain in his left chest. He said, he was told by Dr. Amanda Jungling to call if he is experiencing any type of chest pain

## 2023-08-21 NOTE — Telephone Encounter (Signed)
 Pt states that he starting have chest pain on and off 4 days ago. He reports that he is only taking Ranexa  daily instead of BID. Pt denies SOB, sweating, N/V during chest pain. Denies taking Imdur  daily pt states that he only takes when he feels he needs it. Please advise.

## 2023-08-22 NOTE — Telephone Encounter (Signed)
 Pt notified of Dr. Junita Oliva response. Pt states that he will start the medications on tonight.

## 2023-09-17 DIAGNOSIS — R5383 Other fatigue: Secondary | ICD-10-CM | POA: Diagnosis not present

## 2023-09-17 DIAGNOSIS — Z79899 Other long term (current) drug therapy: Secondary | ICD-10-CM | POA: Diagnosis not present

## 2023-09-17 DIAGNOSIS — E7800 Pure hypercholesterolemia, unspecified: Secondary | ICD-10-CM | POA: Diagnosis not present

## 2023-09-17 DIAGNOSIS — F17210 Nicotine dependence, cigarettes, uncomplicated: Secondary | ICD-10-CM | POA: Diagnosis not present

## 2023-09-17 DIAGNOSIS — I10 Essential (primary) hypertension: Secondary | ICD-10-CM | POA: Diagnosis not present

## 2023-09-17 DIAGNOSIS — Z0000 Encounter for general adult medical examination without abnormal findings: Secondary | ICD-10-CM | POA: Diagnosis not present

## 2023-09-17 DIAGNOSIS — Z299 Encounter for prophylactic measures, unspecified: Secondary | ICD-10-CM | POA: Diagnosis not present

## 2023-09-23 DIAGNOSIS — Z79899 Other long term (current) drug therapy: Secondary | ICD-10-CM | POA: Diagnosis not present

## 2023-09-23 DIAGNOSIS — R5383 Other fatigue: Secondary | ICD-10-CM | POA: Diagnosis not present

## 2023-09-23 DIAGNOSIS — E7800 Pure hypercholesterolemia, unspecified: Secondary | ICD-10-CM | POA: Diagnosis not present

## 2023-09-24 ENCOUNTER — Encounter (INDEPENDENT_AMBULATORY_CARE_PROVIDER_SITE_OTHER): Payer: Self-pay | Admitting: *Deleted

## 2023-10-16 ENCOUNTER — Telehealth: Payer: Self-pay | Admitting: Cardiology

## 2023-10-16 NOTE — Telephone Encounter (Signed)
 Long history of chest pains, stress test 1.5 years ago was fine. Agree monitor symptoms with taking medications appropriately  JINNY Ross MD

## 2023-10-16 NOTE — Telephone Encounter (Signed)
 I speaking with patient,he never took the Ranexa  bid-see 08/21/23 phone note-only taking once a day, and he never re-started Imdur  15 mg daily. He said his bp is normal at home    I advised him to take Ranexa  bid and Imdur  15 mg daily  I have asked him to call us  back with update in a week.

## 2023-10-16 NOTE — Telephone Encounter (Signed)
  Pt c/o of Chest Pain: STAT if active CP, including tightness, pressure, jaw pain, radiating pain to shoulder/upper arm/back, CP unrelieved by Nitro. Symptoms reported of SOB, nausea, vomiting, sweating.  1. Are you having CP right now?  No  2. Are you experiencing any other symptoms (ex. SOB, nausea, vomiting, sweating)?   No   3. Is your CP continuous or coming and going?   Dull pain which comes and goes  4. Have you taken Nitroglycerin ?  No  5. How long have you been experiencing CP? Patient stated chest pain started 3 days ago   6. If NO CP at time of call then end call with telling Pt to call back or call 911 if Chest pain returns prior to return call from triage team.   Patient is concerned he has been having chest pain on the his left side at the site where he had previous surgery.  Patient wants advice on next steps.

## 2023-11-29 DIAGNOSIS — Z299 Encounter for prophylactic measures, unspecified: Secondary | ICD-10-CM | POA: Diagnosis not present

## 2023-11-29 DIAGNOSIS — E119 Type 2 diabetes mellitus without complications: Secondary | ICD-10-CM | POA: Diagnosis not present

## 2023-11-29 DIAGNOSIS — I10 Essential (primary) hypertension: Secondary | ICD-10-CM | POA: Diagnosis not present

## 2023-12-09 DIAGNOSIS — R070 Pain in throat: Secondary | ICD-10-CM | POA: Diagnosis not present

## 2023-12-09 DIAGNOSIS — J069 Acute upper respiratory infection, unspecified: Secondary | ICD-10-CM | POA: Diagnosis not present

## 2024-02-06 DIAGNOSIS — R3912 Poor urinary stream: Secondary | ICD-10-CM | POA: Diagnosis not present

## 2024-02-11 NOTE — Progress Notes (Unsigned)
 Cardiology Office Note    Date:  02/12/2024  ID:  Mark Combs, DOB 08/03/1950, MRN [id number] Cardiologist: Alvan Carrier, MD { :  History of Present Illness:    Mark Combs is a 73 y.o. male with past medical history of CAD (s/p 4-vessel CABG in 2012 in [place], [place] and low-risk NST in 03/2022), HTN, HLD and Type 2 DM who presents to the office today for 70-month follow-up.    He was last examined by Dr. Alvan in 05/2023 and reported occasional episodes of a left-sided, dull chest discomfort which would occur at rest or with activity. Was walking up a flight of stairs without any anginal symptoms. Given his recent reassuring stress test and reported episodic dizziness concerning for orthostatic hypotension, Imdur  was reduced to 15 mg daily. He was continued on Farxiga 10 mg daily, Atorvastatin 80 mg daily, Toprol-XL 25 mg twice daily, Olmesartan  20 mg daily and Ranexa  500 mg twice daily.  He did contact the office in 07/2023 and 09/2023 with recurrent chest pain but was only taking Ranexa  once daily and never restarted Imdur . Therefore, he was encouraged to restart Ranexa  500 mg twice daily and to resume Imdur  15 mg daily.  In talking with the patient today, he reports self-discontinuing ALL of his medications approximately 5 to 6 weeks ago. Says that he had a disagreement with his wife was just frustrated and quit taking the medications. Denies any SI or HI. Reports he was overall tolerating the medications well but was frustrated with the number of medications he was taking at that time. Says that Rybelsus  was not covered well by his insurance. He reports only 1 episode of chest pain in the past 5 to 6 weeks and this resolved with SL NTGx1. 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 No specific palpitations, orthopnea, PND or pitting edema.  Studies Reviewed:   EKG: EKG is ordered  today and demonstrates:   EKG Interpretation Date/Time:  Wednesday February 12 2024 14:48:47 EDT Ventricular Rate:  102 PR Interval:  136 QRS Duration:  82 QT Interval:  324 QTC Calculation: 422 R Axis:   59  Text Interpretation: Sinus tachycardia No acute ST changes. When compared with ECG of 09-Jan-2023 12:23, No significant change was found Confirmed by Johnson Grate ([id number]) on 02/12/2024 2:51:51 PM       NST: 03/2022 Lexiscan  (Walking) Tetrofosmin  stress test 04/12/2022: 1 Day Rest/Stress protocol.  Exercise time 4 minutes, achieved 2.29 METS, and 95% age predicated maximum heart rate.  Stress EKG positive for ischemia. Normal myocardial perfusion without convincing evidence of reversible ischemia or prior infarct. Left ventricular size normal, wall motion preserved, calculated LVEF 60%. No significant change compared to prior study dated 08/12/2019. Low risk study.  Echocardiogram: 03/2022 Normal LV systolic function with visual EF 60-65%. Left ventricle cavity  is normal in size. Normal left ventricular wall thickness. Normal global  wall motion. Normal diastolic filling pattern, normal LAP.  Mild pulmonic regurgitation.  Compared to 08/12/2019 moderate PR is now mild otherwise no significant  change.   Physical Exam:   VS:  BP 136/82   Pulse (!) 102   Ht 5' 7 (1.702 m)   Wt 149 lb (67.6 kg)   SpO2 97%   BMI 23.34 kg/m    Wt Readings from Last 3 Encounters:  02/12/24 149 lb (67.6 kg)  05/29/23 142 lb 12.8  oz (64.8 kg)  01/09/23 142 lb (64.4 kg)     GEN: Pleasant male appearing in no acute distress NECK: No JVD; No carotid bruits CARDIAC: RRR, no murmurs, rubs, gallops RESPIRATORY:  Clear to auscultation without rales, wheezing or rhonchi  ABDOMEN: Appears non-distended. No obvious abdominal masses. EXTREMITIES: No clubbing or cyanosis. No pitting edema.  Distal pedal pulses are 2+ bilaterally.   Assessment and Plan:   1. Coronary artery disease  involving native coronary artery of native heart without angina pectoris - He previously underwent 4-vessel CABG in 2012 in [place], [place] and most recent ischemic evaluation was a low-risk NST in 03/2022. He reports one episode of chest pain within the past 5 to 6 weeks which occurred after discontinuing all cardiac medications. Otherwise, he has been very active at baseline and denies any anginal symptoms. - We reviewed indications for cardiac medications and he is open to gradually adding these back but does not want to restart all of them at the same time. Therefore, will restart ASA 81 mg daily, Atorvastatin 80 mg daily and Toprol-XL 25 mg daily at this time. If he has recurrent anginal pain, we reviewed he could restart Imdur  or Ranexa .  2. Essential hypertension - BP was initially recorded at 146/84, rechecked and at 136/82. Given his elevated BP and tachycardia, will restart Toprol-XL 25 mg daily (listed as taking twice daily but says he was only taking once daily). Was also previously on Olmesartan  20 mg daily but will gradually add back medications given that he previously self-discontinued all medical therapy.  3. Hyperlipidemia LDL goal <55 - Labs were checked by his PCP in 09/2023 and LDL was at 82 and LFT's were within normal limits with AST at 23 and ALT 18.  He did self discontinue Atorvastatin over a month ago as discussed above. Reviewed indications for this and he is open to resuming Atorvastatin. Will therefore restart Atorvastatin 80 mg daily. If compliant, would recheck FLP and LFT's at his next visit.   4. Type 2 diabetes mellitus with other circulatory complication, without long-term current use of insulin (HCC) - Followed by his PCP. He was previously on Farxiga, Metformin and Rybelsus  but quit all medications approximately 5 to 6 weeks ago. Says that Rybelsus  was no longer covered by his insurance  We reviewed the importance of at least restarting Metformin since this is generic.  He plans to establish with a new PCP in the [place] area.  5. Tobacco use - He reports smoking at least 5 cigarettes a day. Cessation advised but no plans of quitting at this time.  Signed, Laymon CHRISTELLA Qua, PA-C

## 2024-02-12 ENCOUNTER — Encounter: Payer: Self-pay | Admitting: Student

## 2024-02-12 ENCOUNTER — Ambulatory Visit: Attending: Student | Admitting: Student

## 2024-02-12 VITALS — BP 136/82 | HR 102 | Ht 67.0 in | Wt 149.0 lb

## 2024-02-12 DIAGNOSIS — I2510 Atherosclerotic heart disease of native coronary artery without angina pectoris: Secondary | ICD-10-CM | POA: Diagnosis not present

## 2024-02-12 DIAGNOSIS — I10 Essential (primary) hypertension: Secondary | ICD-10-CM | POA: Diagnosis not present

## 2024-02-12 DIAGNOSIS — Z720 Tobacco use: Secondary | ICD-10-CM

## 2024-02-12 DIAGNOSIS — E785 Hyperlipidemia, unspecified: Secondary | ICD-10-CM | POA: Diagnosis not present

## 2024-02-12 DIAGNOSIS — E1159 Type 2 diabetes mellitus with other circulatory complications: Secondary | ICD-10-CM

## 2024-02-12 MED ORDER — METOPROLOL SUCCINATE ER 25 MG PO TB24: 25.0000 mg | ORAL_TABLET | Freq: Every day | ORAL | 3 refills | Status: AC

## 2024-02-12 MED ORDER — ASPIRIN 81 MG PO TBEC: 81.0000 mg | DELAYED_RELEASE_TABLET | Freq: Every day | ORAL | 3 refills | Status: AC

## 2024-02-12 NOTE — Patient Instructions (Signed)
 Medication Instructions:  Your physician has recommended you make the following change in your medication:   Restart taking Atorvastatin, Toprol XL and Aspirin    *If you need a refill on your cardiac medications before your next appointment, please call your pharmacy*  Lab Work: NONE   If you have labs (blood work) drawn today and your tests are completely normal, you will receive your results only by: MyChart Message (if you have MyChart) OR A paper copy in the mail If you have any lab test that is abnormal or we need to change your treatment, we will call you to review the results.  Testing/Procedures: NONE    Follow-Up: At [facility], you and your health needs are our priority.  As part of our continuing mission to provide you with exceptional heart care, our providers are all part of one team.  This team includes your primary Cardiologist (physician) and Advanced Practice Providers or APPs (Physician Assistants and Nurse Practitioners) who all work together to provide you with the care you need, when you need it.  Your next appointment:   3 -4 month(s)  Provider:   You may see Alvan Carrier, MD or one of the following Advanced Practice Providers on your designated Care Team:   Laymon Qua, PA-C  [place], [place], [place]     We recommend signing up for the patient portal called MyChart.  Sign up information is provided on this After Visit Summary.  MyChart is used to connect with patients for Virtual Visits (Telemedicine).  Patients are able to view lab/test results, encounter notes, upcoming appointments, etc.  Non-urgent messages can be sent to your provider as well.   To learn more about what you can do with MyChart, go to [email or web address].   Other Instructions Thank you for choosing [facility]!

## 2024-02-28 ENCOUNTER — Other Ambulatory Visit: Payer: Self-pay | Admitting: Cardiology

## 2024-02-28 DIAGNOSIS — I2510 Atherosclerotic heart disease of native coronary artery without angina pectoris: Secondary | ICD-10-CM

## 2024-03-24 NOTE — Progress Notes (Signed)
 Mark Combs                                          MRN: [id number]   03/24/2024   The VBCI Quality Team Specialist reviewed this patient medical record for the purposes of chart review for care gap closure. The following were reviewed: chart review for care gap closure-glycemic status assessment.    VBCI Quality Team

## 2024-03-25 ENCOUNTER — Encounter (INDEPENDENT_AMBULATORY_CARE_PROVIDER_SITE_OTHER): Payer: Self-pay | Admitting: *Deleted

## 2024-05-08 ENCOUNTER — Telehealth: Payer: Self-pay | Admitting: Cardiology

## 2024-05-08 DIAGNOSIS — I25119 Atherosclerotic heart disease of native coronary artery with unspecified angina pectoris: Secondary | ICD-10-CM

## 2024-05-08 DIAGNOSIS — Z951 Presence of aortocoronary bypass graft: Secondary | ICD-10-CM

## 2024-05-08 DIAGNOSIS — I209 Angina pectoris, unspecified: Secondary | ICD-10-CM

## 2024-05-08 NOTE — Telephone Encounter (Signed)
" °*  STAT* If patient is at the pharmacy, call can be transferred to refill team.   1. Which medications need to be refilled? (please list name of each medication and dose if known)   nitroGLYCERIN  (NITROSTAT ) 0.4 MG SL tablet    2. Which pharmacy/location (including street and city if local pharmacy) is medication to be sent to?  [facility] - [place], [place] - [address]    3. Do they need a 30 day or 90 day supply? 90  "

## 2024-05-13 MED ORDER — NITROGLYCERIN 0.4 MG SL SUBL: 0.4000 mg | SUBLINGUAL_TABLET | SUBLINGUAL | 2 refills | Status: AC | PRN

## 2024-05-13 NOTE — Telephone Encounter (Signed)
 Pt's medication was sent to pt's pharmacy as requested. Confirmation received.

## 2024-05-19 ENCOUNTER — Ambulatory Visit: Admitting: Cardiology

## 2024-05-19 NOTE — Progress Notes (Unsigned)
 "     Clinical Summary Mark Combs is a 74 y.o.male  seen today for follow up of the following medical problems.        1.CAD - history of prior CABG in [place] 2012, 4 vessel - 03/2022 nuclear stress: exercised 4 min, abnormal EKG but normal perfusion imaging.  - 03/2022 echo: LVEF 60-65%, no WMAs   - left sided, dull discomfort. 3/10 in severity. No other associated symptoms. Not positiona. No relation to food. Lasts 5-10 minutes. Infrequent, can occur at rest or with activity. Chronic symptoms.  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  - compliant with meds - dizziness at times, he tries spreading out his imdur  and toprol  which helps at times.   01/2024 visit with PA Johnson he had self discontinued his meds.    2. HTN - compliant with meds - home bp's 130s/80s   3. HLD - upcoming labs with pcp     4. DM2 - per pcp       Past Medical History:  Diagnosis Date   Anemia    Angina pectoris    Carotid bruit    Chest pain    Coronary artery disease    Diabetes mellitus without complication (HCC)    GERD (gastroesophageal reflux disease)    Hydrocele    Hyperlipidemia    Hypertension      Allergies[1]   Current Outpatient Medications  Medication Sig Dispense Refill   aspirin  EC 81 MG tablet Take 1 tablet (81 mg total) by mouth daily. Swallow whole. 90 tablet 3   atorvastatin (LIPITOR) 80 MG tablet Take 80 mg by mouth daily. (Patient not taking: Reported on 02/12/2024)     metFORMIN (GLUCOPHAGE) 500 MG tablet Take 500 mg by mouth 2 (two) times daily. (Patient not taking: Reported on 02/12/2024)     metoprolol  succinate (TOPROL -XL) 25 MG 24 hr tablet Take 1 tablet (25 mg total) by mouth daily. 90 tablet 3   nitroGLYCERIN  (NITROSTAT ) 0.4 MG SL tablet Place 1 tablet (0.4 mg total) under the tongue every 5 (five) minutes as needed for chest pain. 75 tablet 2   omeprazole (PRILOSEC) 40 MG capsule Take 40 mg by mouth at bedtime. (Patient not  taking: Reported on 02/12/2024)     tamsulosin (FLOMAX) 0.4 MG CAPS capsule Take 0.4 mg by mouth at bedtime.  (Patient not taking: Reported on 02/12/2024)     No current facility-administered medications for this visit.     Past Surgical History:  Procedure Laterality Date   CARDIAC CATHETERIZATION     CORONARY ARTERY BYPASS GRAFT     HERNIA REPAIR       Allergies[2]    Family History  Problem Relation Age of Onset   Diabetes Sister    Heart disease Brother      Social History Mark Combs reports that he has been smoking cigarettes. He has a 10.3 pack-year smoking history. He has never used smokeless tobacco. Mark Combs reports current alcohol use.   Review of Systems CONSTITUTIONAL: No weight loss, fever, chills, weakness or fatigue.  HEENT: Eyes: No visual loss, blurred vision, double vision or yellow sclerae.No hearing loss, sneezing, congestion, runny nose or sore throat.  SKIN: No rash or itching.  CARDIOVASCULAR:  RESPIRATORY: No shortness of breath, cough or sputum.  GASTROINTESTINAL: No anorexia, nausea, vomiting or diarrhea. No abdominal pain or blood.  GENITOURINARY: No burning on urination, no polyuria NEUROLOGICAL: No headache, dizziness, syncope, paralysis, ataxia,  numbness or tingling in the extremities. No change in bowel or bladder control.  MUSCULOSKELETAL: No muscle, back pain, joint pain or stiffness.  LYMPHATICS: No enlarged nodes. No history of splenectomy.  PSYCHIATRIC: No history of depression or anxiety.  ENDOCRINOLOGIC: No reports of sweating, cold or heat intolerance. No polyuria or polydipsia.  SABRA   Physical Examination There were no vitals filed for this visit. There were no vitals filed for this visit.  Gen: resting comfortably, no acute distress HEENT: no scleral icterus, pupils equal round and reactive, no palptable cervical adenopathy,  CV Resp: Clear to auscultation bilaterally GI: abdomen is soft, non-tender, non-distended, normal  bowel sounds, no hepatosplenomegaly MSK: extremities are warm, no edema.  Skin: warm, no rash Neuro:  no focal deficits Psych: appropriate affect   Diagnostic Studies     Assessment and Plan  1.CAD - chronic atypical chest pains, recent stress testing was benign - due to orthostatic dizziness lower imdur  to 15mg  daily, continue other meds   2. HLD - f/u upcoming labs with pcp   3. HTN - at goal, continue current meds      Dorn PHEBE Ross, M.D., F.A.C.C.    [1] No Known Allergies [2] No Known Allergies  "

## 2024-06-04 ENCOUNTER — Ambulatory Visit: Admitting: Physician Assistant
